# Patient Record
Sex: Male | Born: 1960 | Race: White | Hispanic: No | Smoking: Current every day smoker
Health system: Southern US, Community
[De-identification: ages and names within clinical notes are randomized; demographics above are authoritative.]

## PROBLEM LIST (undated history)

## (undated) DIAGNOSIS — K649 Unspecified hemorrhoids: Secondary | ICD-10-CM

## (undated) DIAGNOSIS — N390 Urinary tract infection, site not specified: Secondary | ICD-10-CM

## (undated) HISTORY — PX: CHOLECYSTECTOMY: SHX55

## (undated) HISTORY — PX: KNEE SURGERY: SHX244

---

## 2016-12-13 DIAGNOSIS — F17200 Nicotine dependence, unspecified, uncomplicated: Secondary | ICD-10-CM | POA: Insufficient documentation

## 2018-09-16 DIAGNOSIS — J438 Other emphysema: Secondary | ICD-10-CM | POA: Insufficient documentation

## 2018-09-16 DIAGNOSIS — Z86010 Personal history of colonic polyps: Secondary | ICD-10-CM | POA: Insufficient documentation

## 2018-12-13 ENCOUNTER — Other Ambulatory Visit: Payer: Self-pay

## 2018-12-13 ENCOUNTER — Encounter (HOSPITAL_BASED_OUTPATIENT_CLINIC_OR_DEPARTMENT_OTHER): Payer: Self-pay

## 2018-12-13 ENCOUNTER — Emergency Department (HOSPITAL_BASED_OUTPATIENT_CLINIC_OR_DEPARTMENT_OTHER): Payer: BLUE CROSS/BLUE SHIELD

## 2018-12-13 ENCOUNTER — Inpatient Hospital Stay (HOSPITAL_BASED_OUTPATIENT_CLINIC_OR_DEPARTMENT_OTHER)
Admission: EM | Admit: 2018-12-13 | Discharge: 2018-12-15 | DRG: 872 | Disposition: A | Discharge status: Home / Self Care | Payer: BLUE CROSS/BLUE SHIELD | Attending: Internal Medicine | Admitting: Internal Medicine

## 2018-12-13 DIAGNOSIS — A419 Sepsis, unspecified organism: Secondary | ICD-10-CM | POA: Diagnosis not present

## 2018-12-13 DIAGNOSIS — R6520 Severe sepsis without septic shock: Secondary | ICD-10-CM | POA: Diagnosis not present

## 2018-12-13 DIAGNOSIS — Z720 Tobacco use: Secondary | ICD-10-CM | POA: Diagnosis not present

## 2018-12-13 DIAGNOSIS — E871 Hypo-osmolality and hyponatremia: Secondary | ICD-10-CM | POA: Diagnosis present

## 2018-12-13 DIAGNOSIS — N390 Urinary tract infection, site not specified: Secondary | ICD-10-CM | POA: Diagnosis present

## 2018-12-13 DIAGNOSIS — F17210 Nicotine dependence, cigarettes, uncomplicated: Secondary | ICD-10-CM | POA: Diagnosis not present

## 2018-12-13 DIAGNOSIS — N451 Epididymitis: Secondary | ICD-10-CM | POA: Diagnosis not present

## 2018-12-13 DIAGNOSIS — N179 Acute kidney failure, unspecified: Secondary | ICD-10-CM | POA: Diagnosis not present

## 2018-12-13 DIAGNOSIS — N289 Disorder of kidney and ureter, unspecified: Secondary | ICD-10-CM | POA: Diagnosis not present

## 2018-12-13 DIAGNOSIS — E860 Dehydration: Secondary | ICD-10-CM | POA: Diagnosis not present

## 2018-12-13 DIAGNOSIS — Z9049 Acquired absence of other specified parts of digestive tract: Secondary | ICD-10-CM | POA: Diagnosis not present

## 2018-12-13 HISTORY — DX: Unspecified hemorrhoids: K64.9

## 2018-12-13 HISTORY — DX: Urinary tract infection, site not specified: N39.0

## 2018-12-13 LAB — CBC WITH DIFFERENTIAL/PLATELET
Abs Immature Granulocytes: 0.12 10*3/uL — ABNORMAL HIGH (ref 0.00–0.07)
Basophils Absolute: 0.1 10*3/uL (ref 0.0–0.1)
Basophils Relative: 0 %
EOS PCT: 0 %
Eosinophils Absolute: 0 10*3/uL (ref 0.0–0.5)
HEMATOCRIT: 44 % (ref 39.0–52.0)
Hemoglobin: 14.1 g/dL (ref 13.0–17.0)
Immature Granulocytes: 1 %
LYMPHS PCT: 4 %
Lymphs Abs: 1 10*3/uL (ref 0.7–4.0)
MCH: 29.5 pg (ref 26.0–34.0)
MCHC: 32 g/dL (ref 30.0–36.0)
MCV: 92.1 fL (ref 80.0–100.0)
Monocytes Absolute: 1.6 10*3/uL — ABNORMAL HIGH (ref 0.1–1.0)
Monocytes Relative: 7 %
Neutro Abs: 20.6 10*3/uL — ABNORMAL HIGH (ref 1.7–7.7)
Neutrophils Relative %: 88 %
Platelets: 307 10*3/uL (ref 150–400)
RBC: 4.78 MIL/uL (ref 4.22–5.81)
RDW: 12.4 % (ref 11.5–15.5)
SMEAR REVIEW: NORMAL
WBC: 23.5 10*3/uL — ABNORMAL HIGH (ref 4.0–10.5)
nRBC: 0 % (ref 0.0–0.2)

## 2018-12-13 LAB — URINALYSIS, MICROSCOPIC (REFLEX): WBC, UA: >50 WBC/hpf (ref 0–5)

## 2018-12-13 LAB — COMPREHENSIVE METABOLIC PANEL
ALT: 12 U/L (ref 0–44)
AST: 14 U/L — ABNORMAL LOW (ref 15–41)
Albumin: 4.2 g/dL (ref 3.5–5.0)
Alkaline Phosphatase: 84 U/L (ref 38–126)
Anion gap: 9 (ref 5–15)
BUN: 18 mg/dL (ref 6–20)
CO2: 24 mmol/L (ref 22–32)
CREATININE: 1.26 mg/dL — AB (ref 0.61–1.24)
Calcium: 9 mg/dL (ref 8.9–10.3)
Chloride: 98 mmol/L (ref 98–111)
GFR calc Af Amer: >60 mL/min (ref >60)
GFR calc non Af Amer: >60 mL/min (ref >60)
Glucose, Bld: 105 mg/dL — ABNORMAL HIGH (ref 70–99)
Potassium: 4.2 mmol/L (ref 3.5–5.1)
Sodium: 131 mmol/L — ABNORMAL LOW (ref 135–145)
Total Bilirubin: 1 mg/dL (ref 0.3–1.2)
Total Protein: 6.9 g/dL (ref 6.5–8.1)

## 2018-12-13 LAB — URINALYSIS, ROUTINE W REFLEX MICROSCOPIC
Glucose, UA: NEGATIVE mg/dL
Ketones, ur: 15 mg/dL — AB
NITRITE: NEGATIVE
Protein, ur: 100 mg/dL — AB
Specific Gravity, Urine: 1.02 (ref 1.005–1.030)
pH: 5.5 (ref 5.0–8.0)

## 2018-12-13 LAB — I-STAT CG4 LACTIC ACID, ED
Lactic Acid, Venous: 1.26 mmol/L (ref 0.5–1.9)
Lactic Acid, Venous: 1.31 mmol/L (ref 0.5–1.9)

## 2018-12-13 LAB — MRSA PCR SCREENING: MRSA by PCR: NEGATIVE

## 2018-12-13 MED ORDER — ACETAMINOPHEN 325 MG PO TABS: 650.0000 mg | ORAL_TABLET | Freq: Four times a day (QID) | ORAL | Status: DC | PRN

## 2018-12-13 MED ORDER — ACETAMINOPHEN 650 MG RE SUPP: 650.0000 mg | Freq: Four times a day (QID) | RECTAL | Status: DC | PRN

## 2018-12-13 MED ORDER — ONDANSETRON HCL 4 MG PO TABS: 4.0000 mg | ORAL_TABLET | Freq: Four times a day (QID) | ORAL | Status: DC | PRN

## 2018-12-13 MED ORDER — SODIUM CHLORIDE 0.9 % IV SOLN
INTRAVENOUS | Status: DC
  Administered 2018-12-13 – 2018-12-15 (×4): via INTRAVENOUS

## 2018-12-13 MED ORDER — FENTANYL CITRATE (PF) 100 MCG/2ML IJ SOLN
50.0000 ug | Freq: Once | INTRAMUSCULAR | Status: AC
  Administered 2018-12-13: 50 ug via INTRAVENOUS
  Filled 2018-12-13: qty 2

## 2018-12-13 MED ORDER — IPRATROPIUM-ALBUTEROL 0.5-2.5 (3) MG/3ML IN SOLN: 3.0000 mL | RESPIRATORY_TRACT | Status: DC | PRN

## 2018-12-13 MED ORDER — HYDROCODONE-ACETAMINOPHEN 5-325 MG PO TABS: 1.0000 | ORAL_TABLET | ORAL | Status: DC | PRN

## 2018-12-13 MED ORDER — LEVOFLOXACIN IN D5W 750 MG/150ML IV SOLN
750.0000 mg | Freq: Every day | INTRAVENOUS | Status: DC
  Administered 2018-12-13 – 2018-12-14 (×2): 750 mg via INTRAVENOUS
  Filled 2018-12-13 (×2): qty 150

## 2018-12-13 MED ORDER — SODIUM CHLORIDE 0.9 % IV BOLUS
1000.0000 mL | Freq: Once | INTRAVENOUS | Status: AC
  Administered 2018-12-13: 1000 mL via INTRAVENOUS

## 2018-12-13 MED ORDER — ONDANSETRON HCL 4 MG/2ML IJ SOLN: 4.0000 mg | Freq: Four times a day (QID) | INTRAMUSCULAR | Status: DC | PRN

## 2018-12-13 MED ORDER — ENOXAPARIN SODIUM 40 MG/0.4ML ~~LOC~~ SOLN
40.0000 mg | SUBCUTANEOUS | Status: DC
  Administered 2018-12-14: 40 mg via SUBCUTANEOUS
  Filled 2018-12-13: qty 0.4

## 2018-12-13 MED ORDER — SODIUM CHLORIDE 0.9 % IV SOLN
1.0000 g | INTRAVENOUS | Status: DC
  Administered 2018-12-13: 1 g via INTRAVENOUS
  Filled 2018-12-13 (×2): qty 10

## 2018-12-13 MED ORDER — IOPAMIDOL (ISOVUE-300) INJECTION 61%
100.0000 mL | Freq: Once | INTRAVENOUS | Status: AC | PRN
  Administered 2018-12-13: 100 mL via INTRAVENOUS

## 2018-12-13 NOTE — ED Notes (Signed)
Patient transported to CT 

## 2018-12-13 NOTE — H&P (Signed)
History and Physical    Joel MeyerDavid Fisk ZOX:[id number] DOB: February 13, 1961 DOA: 12/13/2018  Referring MD/NP/PA: Blanchard ManeElizabeth Matthews, MD PCP: Patient, No Pcp Per  [facility]  Chief Complaint: Difficulty urinating  I have personally briefly reviewed patient's [facility]   HPI: Joel Morton is a [age] y.o. male with medical history significant of UTIs; who presents with complaints of difficulty urinating since yesterday.  Patient reports that he was having severe pain with urination only dribbling small amounts of urine, suprapubic abdominal pain, and had swelling in his right testicle.  He thought that he had a urinary tract infection, and reports just recently being treated for urinary tract infection last month.  Associated symptoms include subjective fever, chills, blood in urine, low back pain, and malaise.  Patient reports that he smokes on a daily basis and has a chronic cough that is unchanged. Denies any alcohol or  illicit drug use.  Lastly, he has had sinus congestion and issues for over a year for which over-the-counter remedies have not helped.   He has not had any dysuria, diarrhea, or vomiting.   ED Course: On admission to the emergency department patient was noted to be febrile up to 101.9 F with blood pressures as low as 94/67, heart rates 84-1 20, respirations 16-29, and O2 saturations 93 to 100% on room air.  Labs revealed WBC 23.5 with left shift, sodium 131, BUN 18, creatinine 1.26, and lactic acid 1.26.  Urinalysis was significant for infection.  Sepsis protocol has been initiated and urine and cultures obtained.  Imaging studies revealed signs of epididymitis and bladder inflammation concerning for cystitis.  Patient has been started on empiric antibiotics Rocephin.  Dr. Sande BrothersWinters of urology was consulted and recommended Levaquin for 3 weeks with Levaquin.  TRH called to admit patient accepted to a stepdown bed.   Review of Systems    Constitutional: Positive for chills and fever.  HENT: Positive for congestion. Negative for nosebleeds.   Eyes: Negative for photophobia and pain.  Respiratory: Positive for cough and sputum production. Negative for hemoptysis and shortness of breath.   Cardiovascular: Negative for chest pain and leg swelling.  Gastrointestinal: Positive for abdominal pain. Negative for diarrhea, nausea and vomiting.  Genitourinary: Positive for flank pain, hematuria and urgency.  Musculoskeletal: Positive for back pain. Negative for falls and myalgias.  Skin: Negative for itching and rash.  Neurological: Negative for focal weakness and loss of consciousness.  Endo/Heme/Allergies: Negative for polydipsia.  Psychiatric/Behavioral: Negative for hallucinations and memory loss.    Past Medical History:  Diagnosis Date  . Hemorrhoid   . UTI (urinary tract infection)     Past Surgical History:  Procedure Laterality Date  . CHOLECYSTECTOMY    . KNEE SURGERY       reports that he has been smoking cigarettes. He has never used smokeless tobacco. He reports that he does not drink alcohol or use drugs.  No Known Allergies  Family History  Problem Relation Age of Onset  . Heart disease Mother   . Hypertension Mother   . Heart disease Father   . Hypertension Father     Prior to Admission medications   Not on File    Physical Exam:  Constitutional: Male who appears to be older than stated age in acute distress at this time Vitals:   12/13/18 1904 12/13/18 2000 12/13/18 2040 12/13/18 2100  BP: 110/65 96/66  103/61  Pulse: 98 91  95  Resp: Marland Kitchen(!)  29 17  (!) 28  Temp: 99 F (37.2 C)  98.7 F (37.1 C)   TempSrc: Oral  Oral   SpO2: 98% 93%  95%  Weight: 104 kg     Height: 6\' 4"  (1.93 m)      Eyes: PERRL, lids and conjunctivae normal ENMT: Mucous membranes are moist. Posterior pharynx clear of any exudate or lesions.   Neck: normal, supple, no masses, no thyromegaly Respiratory: Decreased  overall aeration but no significant wheezes or rhonchi appreciated.  Patient maintaining O2 saturations on room air. Cardiovascular: Tachycardic, no murmurs / rubs / gallops. No extremity edema. 2+ pedal pulses. No carotid bruits.  Abdomen: Mild suprapubic tenderness noted.  GU exam deferred.  Bowel sounds positive.  Musculoskeletal: no clubbing / cyanosis. No joint deformity upper and lower extremities. Good ROM, no contractures. Normal muscle tone.  Skin: no rashes, lesions, ulcers. No induration Neurologic: CN 2-12 grossly intact. Sensation intact, DTR normal. Strength 5/5 in all 4.  Psychiatric: Normal judgment and insight. Alert and oriented x 3. Normal mood.     Labs on Admission: I have personally reviewed following labs and imaging studies  CBC: Recent Labs  Lab 12/13/18 1326  WBC 23.5*  NEUTROABS 20.6*  HGB 14.1  HCT 44.0  MCV 92.1  PLT 307   Basic Metabolic Panel: Recent Labs  Lab 12/13/18 1326  NA 131*  K 4.2  CL 98  CO2 24  GLUCOSE 105*  BUN 18  CREATININE 1.26*  CALCIUM 9.0   GFR: Estimated Creatinine Clearance: 78.5 mL/min (A) (by C-G formula based on SCr of 1.26 mg/dL (H)). Liver Function Tests: Recent Labs  Lab 12/13/18 1326  AST 14*  ALT 12  ALKPHOS 84  BILITOT 1.0  PROT 6.9  ALBUMIN 4.2   No results for input(s): LIPASE, AMYLASE in the last 168 hours. No results for input(s): AMMONIA in the last 168 hours. Coagulation Profile: No results for input(s): INR, PROTIME in the last 168 hours. Cardiac Enzymes: No results for input(s): CKTOTAL, CKMB, CKMBINDEX, TROPONINI in the last 168 hours. BNP (last 3 results) No results for input(s): PROBNP in the last 8760 hours. HbA1C: No results for input(s): HGBA1C in the last 72 hours. CBG: No results for input(s): GLUCAP in the last 168 hours. Lipid Profile: No results for input(s): CHOL, HDL, LDLCALC, TRIG, CHOLHDL, LDLDIRECT in the last 72 hours. Thyroid Function Tests: No results for input(s):  TSH, T4TOTAL, FREET4, T3FREE, THYROIDAB in the last 72 hours. Anemia Panel: No results for input(s): VITAMINB12, FOLATE, FERRITIN, TIBC, IRON, RETICCTPCT in the last 72 hours. Urine analysis:    Component Value Date/Time   COLORURINE YELLOW 12/13/2018 1226   APPEARANCEUR CLOUDY (A) 12/13/2018 1226   LABSPEC 1.020 12/13/2018 1226   PHURINE 5.5 12/13/2018 1226   GLUCOSEU NEGATIVE 12/13/2018 1226   HGBUR LARGE (A) 12/13/2018 1226   BILIRUBINUR SMALL (A) 12/13/2018 1226   KETONESUR 15 (A) 12/13/2018 1226   PROTEINUR 100 (A) 12/13/2018 1226   NITRITE NEGATIVE 12/13/2018 1226   LEUKOCYTESUR LARGE (A) 12/13/2018 1226   Sepsis Labs: No results found for this or any previous visit (from the past 240 hour(s)).   Radiological Exams on Admission: Ct Abdomen Pelvis W Contrast  Result Date: 12/13/2018 CLINICAL DATA:  57 y/o  M; 2 days of right testicle pain. EXAM: CT ABDOMEN AND PELVIS WITH CONTRAST TECHNIQUE: Multidetector CT imaging of the abdomen and pelvis was performed using the standard protocol following bolus administration of intravenous contrast. CONTRAST:   ISOVUE-300 IOPAMIDOL (ISOVUE-300) INJECTION 61% COMPARISON:  12/13/2018 scrotum ultrasound. FINDINGS: Lower chest: No acute abnormality. Hepatobiliary: No focal liver abnormality is seen. Status post cholecystectomy. No biliary dilatation. Pancreas: Unremarkable. No pancreatic ductal dilatation or surrounding inflammatory changes. Spleen: Normal in size without focal abnormality. Adrenals/Urinary Tract: Adrenal glands are unremarkable. Kidneys are normal, without renal calculi, focal lesion, or hydronephrosis. Diffuse bladder wall thickening. Stomach/Bowel: Stomach is within normal limits. Appendix appears normal. No evidence of bowel wall thickening, distention, or inflammatory changes. Vascular/Lymphatic: Aortic atherosclerosis. No enlarged abdominal or pelvic lymph nodes. Reproductive: Inflammation within the pelvis surrounding  seminal vesicles and prostate extending along course of the enhancing right spermatic cord into the right inguinal canal. Other: No abdominal wall hernia or abnormality. No abdominopelvic ascites. Musculoskeletal: No acute or significant osseous findings. IMPRESSION: 1. Inflammation within the pelvis surrounding seminal vesicles and prostate extending along course of the enhancing right spermatic cord into the right inguinal canal. Epididymitis on same-day scrotal ultrasound. Findings likely represent infection of the genital system. 2. Diffuse bladder wall thickening may be reactive, reflect cystitis, or chronic outflow obstruction. Electronically Signed   By: Mitzi HansenLance  Furusawa-Stratton M.D.   On: 12/13/2018 15:28   [place] Scrotum W/doppler  Result Date: 12/13/2018 CLINICAL DATA:  Right testicle pain for 2 days with decreased urine output EXAM: SCROTAL ULTRASOUND DOPPLER ULTRASOUND OF THE TESTICLES TECHNIQUE: Complete ultrasound examination of the testicles, epididymis, and other scrotal structures was performed. Color and spectral Doppler ultrasound were also utilized to evaluate blood flow to the testicles. COMPARISON:  None. FINDINGS: Right testicle Measurements: 4.6 x 3.2 x 3.5 cm. No mass or microlithiasis visualized. Heterogeneous hypervascular echogenic tissue superior to the right testis. Left testicle Measurements: 5.4 x 2.6 x 3.4 cm. No mass or microlithiasis visualized. Right epididymis: Markedly enlarged and hypervascular. Cyst in the epididymal tail measuring 1.3 cm Left epididymis:  Small cyst at the head measuring 9 mm. Hydrocele:  Moderate complex right hydrocele with septations. Varicocele:  Small left varicocele. Pulsed Doppler interrogation of both testes demonstrates normal low resistance arterial and venous waveforms bilaterally. IMPRESSION: 1. Negative for testicular torsion 2. Markedly enlarged and hyperemic right epididymis consistent with epididymitis. Moderate complex right hydrocele,  possible pyocele. 3. Echogenic soft tissue superior to the right testis, possibly represents edematous spermatic cord, although could consider a hernia. CT could be obtained to exclude bowel containing hernia in the right inguinal region. Electronically Signed   By: Jasmine PangKim  Fujinaga M.D.   On: 12/13/2018 14:21    CT scan of abdomen pelvis: Independently reviewed.  Visualized inflammation of the bladder and right testicle  Assessment/Plan Sepsis secondary to urinary tract infection and right epididymitis: Patient presents with fever up to 101.9 F, tachycardia, tachypnea, and leukocytosis.  Lactic acids were reassuring.  Patient was initially noted to be hypotensive, but responded adequately to IV fluid hydration.  Urinalysis showed signs of infection along with imaging studies confirm showed cystitis and right epididymitis.  Patient had initially been given Rocephin IV.  Urology was consulted and recommended 3 weeks of Levaquin. - Admited to stepdown bed transferred to a telemetry bed - Follow-up blood and urine cultures - Changed antibiotics to Levaquin per urology recommendation - Tylenol as needed for fever - Scrotal support - Hydrocodone as needed pain  Renal insufficiency: Patient noted to have a creatinine of 1.11 on 09/13/2018 on care everywhere records.  He presents with a creatinine of 1.26 with BUN 18. - IV fluids at 18000ml/hr - Monitor intake and output  Transient  hypotension: Patient blood pressure was noted to be stable after IV fluids. - Continue to monitor and bolus IV fluids as needed  Sinus sinus congestion: Patient reports sinus congestion and postnasal drip for a year.  Patient already on antibiotics which should cover for any sinus infection. - Mucinex and Claritin  Hyponatremia: Acute.  Sodium 131 on admission.  The aspect of dehydration - IV fluids as seen above - Recheck sodium in a.m.  Tobacco abuse: Patient notes daily use of tobacco. - Nicotine patch offered  DVT  prophylaxis: lovenox Code Status: Full Family Communication: No family present at bedside Disposition Plan: Likely discharge home in 2 to 3 days Consults called: urology Admission status: inpatient   Clydie Braun MD [facility] Pager [phone]   If 7PM-7AM, please contact night-coverage [email or web address] Password [facility]  12/13/2018, 9:32 PM

## 2018-12-13 NOTE — ED Triage Notes (Signed)
Pt c/o right testicle pain and decreased urine x 2 days-reports hx of UTIs-NAD-steady gait

## 2018-12-13 NOTE — ED Notes (Signed)
Po fluid given

## 2018-12-13 NOTE — Plan of Care (Signed)
57 year old male with no significant past medical history admitted with urosepsis.  Patient febrile to 101.9 tachycardic and hypotensive on arrival to the ER.  CT scan and ultrasound of the scrotum revealed epididymitis.  ER physician spoke with urology Dr. Sande BrothersWinters recommended treatment with levofloxacin for 3 weeks and to give him daily Flomax.

## 2018-12-13 NOTE — ED Provider Notes (Signed)
[facility] EMERGENCY DEPARTMENT Provider Note   CSN: [id number] Arrival date & time: 12/13/18  1102     History   Chief Complaint Chief Complaint  Patient presents with  . Groin Swelling    HPI Joel Morton is a 57 y.o. male.  The history is provided by the patient. No language interpreter was used.   Joel Morton is a 57 y.o. male who presents to the Emergency Department complaining of "I think I have a UTI". Yesterday he developed pain in his low back, dribbling urination with dysuria and swelling of his right testicle. He denies any fevers, nausea, vomiting. He does have mild lower abdominal pain. No penile discharge. No prior similar symptoms. He is currently separated and is not sexually active. He does smoke cigarettes. No medical problems. He does not drink alcohol or use drugs.  Sxs are severe and constant in nature.   Past Medical History:  Diagnosis Date  . Hemorrhoid   . UTI (urinary tract infection)     There are no active problems to display for this patient.   Past Surgical History:  Procedure Laterality Date  . CHOLECYSTECTOMY    . KNEE SURGERY          Home Medications    Prior to Admission medications   Not on File    Family History No family history on file.  Social History Social History   Tobacco Use  . Smoking status: Current Every Day Smoker    Types: Cigarettes  . Smokeless tobacco: Never Used  Substance Use Topics  . Alcohol use: Never    Frequency: Never  . Drug use: Never     Allergies   Patient has no known allergies.   Review of Systems Review of Systems  All other systems reviewed and are negative.    Physical Exam Updated Vital Signs BP 94/67   Pulse 99   Temp 100 F (37.8 C)   Resp 17   Ht 6\' 4"  (1.93 m)   Wt 109.8 kg   SpO2 100%   BMI 29.46 kg/m   Physical Exam Vitals signs and nursing note reviewed.  Constitutional:      Appearance: He is well-developed.  HENT:     Head:  Normocephalic and atraumatic.  Cardiovascular:     Rate and Rhythm: Regular rhythm.     Heart sounds: No murmur.     Comments: Tachycardic Pulmonary:     Effort: Pulmonary effort is normal. No respiratory distress.     Breath sounds: Normal breath sounds.  Abdominal:     Palpations: Abdomen is soft.     Tenderness: There is no abdominal tenderness. There is no guarding or rebound.  Genitourinary:    Comments: Moderate to severe swelling of the right testicle with significant tenderness to palpation. Musculoskeletal:        General: No swelling or tenderness.  Skin:    General: Skin is warm and dry.  Neurological:     Mental Status: He is alert and oriented to person, place, and time.  Psychiatric:        Mood and Affect: Mood normal.        Behavior: Behavior normal.      ED Treatments / Results  Labs (all labs ordered are listed, but only abnormal results are displayed) Labs Reviewed  URINALYSIS, ROUTINE W REFLEX MICROSCOPIC - Abnormal; Notable for the following components:      Result Value   APPearance CLOUDY (*)  Hgb urine dipstick LARGE (*)    Bilirubin Urine SMALL (*)    Ketones, ur 15 (*)    Protein, ur 100 (*)    Leukocytes, UA LARGE (*)    All other components within normal limits  URINALYSIS, MICROSCOPIC (REFLEX) - Abnormal; Notable for the following components:   Bacteria, UA MANY (*)    All other components within normal limits  COMPREHENSIVE METABOLIC PANEL - Abnormal; Notable for the following components:   Sodium 131 (*)    Glucose, Bld 105 (*)    Creatinine, Ser 1.26 (*)    AST 14 (*)    All other components within normal limits  CBC WITH DIFFERENTIAL/PLATELET - Abnormal; Notable for the following components:   WBC 23.5 (*)    Neutro Abs 20.6 (*)    Monocytes Absolute 1.6 (*)    Abs Immature Granulocytes 0.12 (*)    All other components within normal limits  URINE CULTURE  CULTURE, BLOOD (ROUTINE X 2)  CULTURE, BLOOD (ROUTINE X 2)  RPR  HIV  ANTIBODY (ROUTINE TESTING W REFLEX)  I-STAT CG4 LACTIC ACID, ED  I-STAT CG4 LACTIC ACID, ED  GC/CHLAMYDIA PROBE AMP () NOT [facility]    EKG None  Radiology Ct Abdomen Pelvis W Contrast  Result Date: 12/13/2018 CLINICAL DATA:  57 y/o  M; 2 days of right testicle pain. EXAM: CT ABDOMEN AND PELVIS WITH CONTRAST TECHNIQUE: Multidetector CT imaging of the abdomen and pelvis was performed using the standard protocol following bolus administration of intravenous contrast. CONTRAST:  100mL ISOVUE-300 IOPAMIDOL (ISOVUE-300) INJECTION 61% COMPARISON:  12/13/2018 scrotum ultrasound. FINDINGS: Lower chest: No acute abnormality. Hepatobiliary: No focal liver abnormality is seen. Status post cholecystectomy. No biliary dilatation. Pancreas: Unremarkable. No pancreatic ductal dilatation or surrounding inflammatory changes. Spleen: Normal in size without focal abnormality. Adrenals/Urinary Tract: Adrenal glands are unremarkable. Kidneys are normal, without renal calculi, focal lesion, or hydronephrosis. Diffuse bladder wall thickening. Stomach/Bowel: Stomach is within normal limits. Appendix appears normal. No evidence of bowel wall thickening, distention, or inflammatory changes. Vascular/Lymphatic: Aortic atherosclerosis. No enlarged abdominal or pelvic lymph nodes. Reproductive: Inflammation within the pelvis surrounding seminal vesicles and prostate extending along course of the enhancing right spermatic cord into the right inguinal canal. Other: No abdominal wall hernia or abnormality. No abdominopelvic ascites. Musculoskeletal: No acute or significant osseous findings. IMPRESSION: 1. Inflammation within the pelvis surrounding seminal vesicles and prostate extending along course of the enhancing right spermatic cord into the right inguinal canal. Epididymitis on same-day scrotal ultrasound. Findings likely represent infection of the genital system. 2. Diffuse bladder wall thickening may be reactive,  reflect cystitis, or chronic outflow obstruction. Electronically Signed   By: Mitzi HansenLance  Furusawa-Stratton M.D.   On: 12/13/2018 15:28   [place] Scrotum W/doppler  Result Date: 12/13/2018 CLINICAL DATA:  Right testicle pain for 2 days with decreased urine output EXAM: SCROTAL ULTRASOUND DOPPLER ULTRASOUND OF THE TESTICLES TECHNIQUE: Complete ultrasound examination of the testicles, epididymis, and other scrotal structures was performed. Color and spectral Doppler ultrasound were also utilized to evaluate blood flow to the testicles. COMPARISON:  None. FINDINGS: Right testicle Measurements: 4.6 x 3.2 x 3.5 cm. No mass or microlithiasis visualized. Heterogeneous hypervascular echogenic tissue superior to the right testis. Left testicle Measurements: 5.4 x 2.6 x 3.4 cm. No mass or microlithiasis visualized. Right epididymis: Markedly enlarged and hypervascular. Cyst in the epididymal tail measuring 1.3 cm Left epididymis:  Small cyst at the head measuring 9 mm. Hydrocele:  Moderate complex right hydrocele  with septations. Varicocele:  Small left varicocele. Pulsed Doppler interrogation of both testes demonstrates normal low resistance arterial and venous waveforms bilaterally. IMPRESSION: 1. Negative for testicular torsion 2. Markedly enlarged and hyperemic right epididymis consistent with epididymitis. Moderate complex right hydrocele, possible pyocele. 3. Echogenic soft tissue superior to the right testis, possibly represents edematous spermatic cord, although could consider a hernia. CT could be obtained to exclude bowel containing hernia in the right inguinal region. Electronically Signed   By: Jasmine PangKim  Fujinaga M.D.   On: 12/13/2018 14:21    Procedures Procedures (including critical care time) CRITICAL CARE Performed by: Tilden FossaElizabeth Colleen Kotlarz   Total critical care time: 35 minutes  Critical care time was exclusive of separately billable procedures and treating other patients.  Critical care was necessary to treat or  prevent imminent or life-threatening deterioration.  Critical care was time spent personally by me on the following activities: development of treatment plan with patient and/or surrogate as well as nursing, discussions with consultants, evaluation of patient's response to treatment, examination of patient, obtaining history from patient or surrogate, ordering and performing treatments and interventions, ordering and review of laboratory studies, ordering and review of radiographic studies, pulse oximetry and re-evaluation of patient's condition.  Medications Ordered in ED Medications  cefTRIAXone (ROCEPHIN) 1 g in sodium chloride 0.9 % 100 mL IVPB ( Intravenous Stopped 12/13/18 1447)  sodium chloride 0.9 % bolus 1,000 mL (1,000 mLs Intravenous New Bag/Given 12/13/18 1530)  sodium chloride 0.9 % bolus 1,000 mL ( Intravenous Stopped 12/13/18 1507)  fentaNYL (SUBLIMAZE) injection 50 mcg (50 mcg Intravenous Given 12/13/18 1324)  iopamidol (ISOVUE-300) 61 % injection 100 mL (100 mLs Intravenous Contrast Given 12/13/18 1510)     Initial Impression / Assessment and Plan / ED Course  I have reviewed the triage vital signs and the nursing notes.  Pertinent labs & imaging results that were available during my care of the patient were reviewed by me and considered in my medical decision making (see chart for details).     Patient here for evaluation of scrotal swelling, pain and difficulty with urination for the last 24 hours. He is tachycardic and dehydrated appearing on evaluation. He was treated with IV fluids and IV antibiotics for presumed sepsis of urinary source. Ultrasound is consistent with epididymitis and UA is consistent with UTI. BMP demonstrates acute kidney injury. Lactate is with normal limits. Discussed the case with Dr. Liliane ShiWinter with urology. No surgical intervention needed at this time. He recommends flomax daily, levaquin daily x 3 weeks. Patient updated of findings of studies and  recommendation for admission and he is in agreement with treatment plan. Hospitalist consulted for admission.  Final Clinical Impressions(s) / ED Diagnoses   Final diagnoses:  Sepsis with acute organ dysfunction without septic shock, due to unspecified organism, unspecified type [facility](HCC)  Epididymitis  Acute UTI    ED Discharge Orders    None       Tilden Fossaees, Deundre Thong, MD 12/13/18 1558

## 2018-12-13 NOTE — ED Notes (Signed)
Assumed care of pt. At 1305. Pt to US before IV a/b.

## 2018-12-14 ENCOUNTER — Encounter (HOSPITAL_COMMUNITY): Payer: Self-pay | Admitting: Internal Medicine

## 2018-12-14 DIAGNOSIS — N451 Epididymitis: Secondary | ICD-10-CM | POA: Diagnosis present

## 2018-12-14 DIAGNOSIS — Z720 Tobacco use: Secondary | ICD-10-CM | POA: Diagnosis present

## 2018-12-14 DIAGNOSIS — E871 Hypo-osmolality and hyponatremia: Secondary | ICD-10-CM | POA: Diagnosis present

## 2018-12-14 DIAGNOSIS — N390 Urinary tract infection, site not specified: Secondary | ICD-10-CM | POA: Diagnosis present

## 2018-12-14 DIAGNOSIS — N289 Disorder of kidney and ureter, unspecified: Secondary | ICD-10-CM

## 2018-12-14 LAB — BASIC METABOLIC PANEL
Anion gap: 16 — ABNORMAL HIGH (ref 5–15)
BUN: 15 mg/dL (ref 6–20)
CHLORIDE: 103 mmol/L (ref 98–111)
CO2: 15 mmol/L — ABNORMAL LOW (ref 22–32)
Calcium: 8.3 mg/dL — ABNORMAL LOW (ref 8.9–10.3)
Creatinine, Ser: 0.98 mg/dL (ref 0.61–1.24)
GFR calc Af Amer: >60 mL/min (ref >60)
GFR calc non Af Amer: >60 mL/min (ref >60)
Glucose, Bld: 98 mg/dL (ref 70–99)
Potassium: 4 mmol/L (ref 3.5–5.1)
SODIUM: 134 mmol/L — AB (ref 135–145)

## 2018-12-14 LAB — URINE CULTURE: Culture: NO GROWTH

## 2018-12-14 LAB — CBC
HCT: 40.1 % (ref 39.0–52.0)
HEMOGLOBIN: 12.9 g/dL — AB (ref 13.0–17.0)
MCH: 29.8 pg (ref 26.0–34.0)
MCHC: 32.2 g/dL (ref 30.0–36.0)
MCV: 92.6 fL (ref 80.0–100.0)
Platelets: 287 10*3/uL (ref 150–400)
RBC: 4.33 MIL/uL (ref 4.22–5.81)
RDW: 12.6 % (ref 11.5–15.5)
WBC: 18.7 10*3/uL — ABNORMAL HIGH (ref 4.0–10.5)
nRBC: 0 % (ref 0.0–0.2)

## 2018-12-14 LAB — HIV ANTIBODY (ROUTINE TESTING W REFLEX): HIV Screen 4th Generation wRfx: NONREACTIVE

## 2018-12-14 LAB — RPR: RPR Ser Ql: NONREACTIVE

## 2018-12-14 MED ORDER — NICOTINE 21 MG/24HR TD PT24
21.0000 mg | MEDICATED_PATCH | Freq: Every day | TRANSDERMAL | Status: DC
  Administered 2018-12-14 – 2018-12-15 (×3): 21 mg via TRANSDERMAL
  Filled 2018-12-14 (×3): qty 1

## 2018-12-14 MED ORDER — LORATADINE 10 MG PO TABS
10.0000 mg | ORAL_TABLET | Freq: Every day | ORAL | Status: DC
  Administered 2018-12-14 – 2018-12-15 (×2): 10 mg via ORAL
  Filled 2018-12-14 (×2): qty 1

## 2018-12-14 MED ORDER — GUAIFENESIN ER 600 MG PO TB12
600.0000 mg | ORAL_TABLET | Freq: Two times a day (BID) | ORAL | Status: DC
  Administered 2018-12-14 – 2018-12-15 (×3): 600 mg via ORAL
  Filled 2018-12-14 (×4): qty 1

## 2018-12-14 NOTE — Plan of Care (Signed)
Patient states his dysuria has improved, scrotum still edematous and uncomfortable but patient defers pain medication.  Tolerating diet, walked in hallways and sat in chair most of shift.

## 2018-12-14 NOTE — Progress Notes (Signed)
PROGRESS NOTE    Joel Morton  HYQ:[id number] DOB: 13-Jul-1961 DOA: 12/13/2018 PCP: Patient, No Pcp Per   Brief Narrative:  Per HPI  Joel Morton is Joel Morton 57 y.o. male with medical history significant of UTIs; who presents with complaints of difficulty urinating since yesterday.  Patient reports that he was having severe pain with urination only dribbling small amounts of urine, suprapubic abdominal pain, and had swelling in his right testicle.  He thought that he had Joel Morton urinary tract infection, and reports just recently being treated for urinary tract infection last month.  Associated symptoms include subjective fever, chills, blood in urine, low back pain, and malaise.  Patient reports that he smokes on Joel Morton daily basis and has Joel Morton chronic cough that is unchanged. Denies any alcohol or  illicit drug use.  Lastly, he has had sinus congestion and issues for over [name] year for which over-the-counter remedies have not helped.   He has not had any dysuria, diarrhea, or vomiting.   Assessment & Plan:   Principal Problem:   Sepsis (HCC) Active Problems:   Right epididymitis   Acute lower UTI   Hyponatremia   Tobacco abuse   Renal insufficiency  Sepsis secondary to urinary tract infection and right epididymitis: Patient presents with fever up to 101.9 F, tachycardia, tachypnea, and leukocytosis.  Lactic acids were reassuring.  Patient was initially noted to be hypotensive, but responded adequately to IV fluid hydration.  Urinalysis showed signs of infection along with imaging studies confirm showed cystitis and right epididymitis.  Patient had initially been given Rocephin IV.  Urology was consulted and recommended 3 weeks of Levaquin.  He still notes significant pain and discomfort. - Leukocytosis improving, but still elevated - Follow-up blood (pending) and urine cultures (no growth) - Continue levaquin per urology - Follow gonorrhea/chlamydia, negative HIV - Tylenol as needed for fever - Scrotal  support - Hydrocodone as needed pain  Renal insufficiency: improved, continue to monitor  Transient hypotension: Patient blood pressure was noted to be stable after IV fluids. - Continue to monitor and bolus IV fluids as needed  Sinus congestion: Patient reports sinus congestion and postnasal drip for Joel Morton year.  Patient already on antibiotics which should cover for any sinus infection. - Mucinex and Claritin  Hyponatremia: Improved Follow  Tobacco abuse: Patient notes daily use of tobacco. - Nicotine patch offered  DVT prophylaxis: lovenox Code Status: full  Family Communication: none at bedside Disposition Plan: pending improvement, likely within 24-48 hrs   Consultants:   none  Procedures:   none  Antimicrobials: Anti-infectives (From admission, onward)   Start     Dose/Rate Route Frequency Ordered Stop   12/13/18 2315  levofloxacin (LEVAQUIN) IVPB 750 mg     750 mg 100 mL/hr over 90 Minutes Intravenous Daily at bedtime 12/13/18 2246     12/13/18 1300  cefTRIAXone (ROCEPHIN) 1 g in sodium chloride 0.9 % 100 mL IVPB  Status:  Discontinued     1 g 200 mL/hr over 30 Minutes Intravenous Every 24 hours 12/13/18 1253 12/14/18 1309     Subjective: Still with pain to scrotum On R side  Objective: Vitals:   12/14/18 0100 12/14/18 0129 12/14/18 0553 12/14/18 1338  BP: 103/62 104/64 96/68 115/74  Pulse: (!) 111 98 96 91  Resp: (!) 23 (!) 24 20 20   Temp:  100 F (37.8 C) 99.6 F (37.6 C) 99.8 F (37.7 C)  TempSrc:  Oral Oral Oral  SpO2: 98% 98% 98% 100%  Weight:  Height:        Intake/Output Summary (Last 24 hours) at 12/14/2018 1943 Last data filed at 12/14/2018 1800 Gross per 24 hour  Intake 1216.18 ml  Output 1100 ml  Net 116.18 ml   Filed Weights   12/13/18 1128 12/13/18 1904  Weight: 109.8 kg 104 kg    Examination:  General exam: Appears calm and comfortable  Respiratory system: Clear to auscultation. Respiratory effort  normal. Cardiovascular system: S1 & S2 heard, RRR.  Gastrointestinal system: Abdomen is nondistended, soft and nontender.  GU: TTP of R testicle Central nervous system: Alert and oriented. No focal neurological deficits. Extremities: moving all extremities Skin: No rashes, lesions or ulcers Psychiatry: Judgement and insight appear normal. Mood & affect appropriate.     Data Reviewed: I have personally reviewed following labs and imaging studies  CBC: Recent Labs  Lab 12/13/18 1326 12/14/18 0452  WBC 23.5* 18.7*  NEUTROABS 20.6*  --   HGB 14.1 12.9*  HCT 44.0 40.1  MCV 92.1 92.6  PLT 307 287   Basic Metabolic Panel: Recent Labs  Lab 12/13/18 1326 12/14/18 0452  NA 131* 134*  K 4.2 4.0  CL 98 103  CO2 24 15*  GLUCOSE 105* 98  BUN 18 15  CREATININE 1.26* 0.98  CALCIUM 9.0 8.3*   GFR: Estimated Creatinine Clearance: 100.9 mL/min (by C-G formula based on SCr of 0.98 mg/dL). Liver Function Tests: Recent Labs  Lab 12/13/18 1326  AST 14*  ALT 12  ALKPHOS 84  BILITOT 1.0  PROT 6.9  ALBUMIN 4.2   No results for input(s): LIPASE, AMYLASE in the last 168 hours. No results for input(s): AMMONIA in the last 168 hours. Coagulation Profile: No results for input(s): INR, PROTIME in the last 168 hours. Cardiac Enzymes: No results for input(s): CKTOTAL, CKMB, CKMBINDEX, TROPONINI in the last 168 hours. BNP (last 3 results) No results for input(s): PROBNP in the last 8760 hours. HbA1C: No results for input(s): HGBA1C in the last 72 hours. CBG: No results for input(s): GLUCAP in the last 168 hours. Lipid Profile: No results for input(s): CHOL, HDL, LDLCALC, TRIG, CHOLHDL, LDLDIRECT in the last 72 hours. Thyroid Function Tests: No results for input(s): TSH, T4TOTAL, FREET4, T3FREE, THYROIDAB in the last 72 hours. Anemia Panel: No results for input(s): VITAMINB12, FOLATE, FERRITIN, TIBC, IRON, RETICCTPCT in the last 72 hours. Sepsis Labs: Recent Labs  Lab  12/13/18 1326 12/13/18 1529  LATICACIDVEN 1.26 1.31    Recent Results (from the past 240 hour(s))  Urine culture     Status: None   Collection Time: 12/13/18 12:26 PM  Result Value Ref Range Status   Specimen Description   Final    URINE, CLEAN CATCH Performed at [facility] [place], [address] [place], [place] [address]    Special Requests   Final    NONE Performed at [facility] [place], [address] [place], [place] [address]    Culture   Final    NO GROWTH Performed at [facility], [address] [place], [place] [address]    Report Status 12/14/2018 FINAL  Final  Blood Culture (routine x 2)     Status: None (Preliminary result)   Collection Time: 12/13/18  1:20 PM  Result Value Ref Range Status   Specimen Description   Final    BLOOD RIGHT ARM Performed at [facility] [place], [address] [place], [place] [address]    Special Requests  Final    BOTTLES DRAWN AEROBIC AND ANAEROBIC Blood Culture adequate volume Performed at [facility], [address] [place], [place] [address]    Culture   Final    NO GROWTH < 24 HOURS Performed at [facility], [address] [place], [place] [address]    Report Status PENDING  Incomplete  Blood Culture (routine x 2)     Status: None (Preliminary result)   Collection Time: 12/13/18  1:35 PM  Result Value Ref Range Status   Specimen Description   Final    BLOOD LEFT HAND Performed at [facility], [address] [place], [place] [address]    Special Requests   Final    BOTTLES DRAWN AEROBIC AND ANAEROBIC Blood Culture adequate volume Performed at [facility], [address] [place], [place] [address]    Culture   Final    NO GROWTH < 24 HOURS Performed at [facility], [address] [place], [place] [address]    Report Status PENDING  Incomplete  MRSA PCR Screening     Status: None   Collection Time: 12/13/18  7:12 PM  Result Value Ref  Range Status   MRSA by PCR NEGATIVE NEGATIVE Final    Comment:        The GeneXpert MRSA Assay (FDA approved for NASAL specimens only), is one component of Cathye Kreiter comprehensive MRSA colonization surveillance program. It is not intended to diagnose MRSA infection nor to guide or monitor treatment for MRSA infections. Performed at [facility], [address], [place], [place] [address]          Radiology Studies: Ct Abdomen Pelvis W Contrast  Result Date: 12/13/2018 CLINICAL DATA:  57 y/o  M; 2 days of right testicle pain. EXAM: CT ABDOMEN AND PELVIS WITH CONTRAST TECHNIQUE: Multidetector CT imaging of the abdomen and pelvis was performed using the standard protocol following bolus administration of intravenous contrast. CONTRAST:  ISOVUE-300 IOPAMIDOL (ISOVUE-300) INJECTION 61% COMPARISON:  12/13/2018 scrotum ultrasound. FINDINGS: Lower chest: No acute abnormality. Hepatobiliary: No focal liver abnormality is seen. Status post cholecystectomy. No biliary dilatation. Pancreas: Unremarkable. No pancreatic ductal dilatation or surrounding inflammatory changes. Spleen: Normal in size without focal abnormality. Adrenals/Urinary Tract: Adrenal glands are unremarkable. Kidneys are normal, without renal calculi, focal lesion, or hydronephrosis. Diffuse bladder wall thickening. Stomach/Bowel: Stomach is within normal limits. Appendix appears normal. No evidence of bowel wall thickening, distention, or inflammatory changes. Vascular/Lymphatic: Aortic atherosclerosis. No enlarged abdominal or pelvic lymph nodes. Reproductive: Inflammation within the pelvis surrounding seminal vesicles and prostate extending along course of the enhancing right spermatic cord into the right inguinal canal. Other: No abdominal wall hernia or abnormality. No abdominopelvic ascites. Musculoskeletal: No acute or significant osseous findings. IMPRESSION: 1. Inflammation within the pelvis surrounding seminal  vesicles and prostate extending along course of the enhancing right spermatic cord into the right inguinal canal. Epididymitis on same-day scrotal ultrasound. Findings likely represent infection of the genital system. 2. Diffuse bladder wall thickening may be reactive, reflect cystitis, or chronic outflow obstruction. Electronically Signed   By: Mitzi Hansen M.D.   On: 12/13/2018 15:28   US Scrotum W/doppler  Result Date: 12/13/2018 CLINICAL DATA:  Right testicle pain for 2 days with decreased urine output EXAM: SCROTAL ULTRASOUND DOPPLER ULTRASOUND OF THE TESTICLES TECHNIQUE: Complete ultrasound examination of the testicles, epididymis, and other scrotal structures was performed. Color and spectral Doppler ultrasound were also  utilized to evaluate blood flow to the testicles. COMPARISON:  None. FINDINGS: Right testicle Measurements: 4.6 x 3.2 x 3.5 cm. No mass or microlithiasis visualized. Heterogeneous hypervascular echogenic tissue superior to the right testis. Left testicle Measurements: 5.4 x 2.6 x 3.4 cm. No mass or microlithiasis visualized. Right epididymis: Markedly enlarged and hypervascular. Cyst in the epididymal tail measuring 1.3 cm Left epididymis:  Small cyst at the head measuring 9 mm. Hydrocele:  Moderate complex right hydrocele with septations. Varicocele:  Small left varicocele. Pulsed Doppler interrogation of both testes demonstrates normal low resistance arterial and venous waveforms bilaterally. IMPRESSION: 1. Negative for testicular torsion 2. Markedly enlarged and hyperemic right epididymis consistent with epididymitis. Moderate complex right hydrocele, possible pyocele. 3. Echogenic soft tissue superior to the right testis, possibly represents edematous spermatic cord, although could consider Nevin Grizzle hernia. CT could be obtained to exclude bowel containing hernia in the right inguinal region. Electronically Signed   By: Jasmine PangKim  Fujinaga M.D.   On: 12/13/2018 14:21         Scheduled Meds: . enoxaparin (LOVENOX) injection  40 mg Subcutaneous Q24H  . guaiFENesin  600 mg Oral BID  . loratadine  10 mg Oral Daily  . nicotine  21 mg Transdermal Daily   Continuous Infusions: . sodium chloride 100 mL/hr at 12/14/18 1111  . levofloxacin (LEVAQUIN) IV 100 mL/hr at 12/14/18 0100     LOS: 1 day    Time spent: over 30 min    Lacretia Nicksaldwell Powell, MD [facility] Pager [phone]  If 7PM-7AM, please contact night-coverage [email or web address] Password [facility] 12/14/2018, 7:43 PM

## 2018-12-15 LAB — CBC
HCT: 41.3 % (ref 39.0–52.0)
Hemoglobin: 12.9 g/dL — ABNORMAL LOW (ref 13.0–17.0)
MCH: 29.9 pg (ref 26.0–34.0)
MCHC: 31.2 g/dL (ref 30.0–36.0)
MCV: 95.8 fL (ref 80.0–100.0)
Platelets: 243 10*3/uL (ref 150–400)
RBC: 4.31 MIL/uL (ref 4.22–5.81)
RDW: 12.7 % (ref 11.5–15.5)
WBC: 12.1 10*3/uL — ABNORMAL HIGH (ref 4.0–10.5)
nRBC: 0 % (ref 0.0–0.2)

## 2018-12-15 LAB — COMPREHENSIVE METABOLIC PANEL
ALT: 14 U/L (ref 0–44)
AST: 15 U/L (ref 15–41)
Albumin: 3.3 g/dL — ABNORMAL LOW (ref 3.5–5.0)
Alkaline Phosphatase: 68 U/L (ref 38–126)
Anion gap: 12 (ref 5–15)
BUN: 14 mg/dL (ref 6–20)
CO2: 18 mmol/L — ABNORMAL LOW (ref 22–32)
Calcium: 8.3 mg/dL — ABNORMAL LOW (ref 8.9–10.3)
Chloride: 108 mmol/L (ref 98–111)
Creatinine, Ser: 0.94 mg/dL (ref 0.61–1.24)
GFR calc Af Amer: >60 mL/min (ref >60)
GFR calc non Af Amer: >60 mL/min (ref >60)
GLUCOSE: 92 mg/dL (ref 70–99)
Potassium: 4 mmol/L (ref 3.5–5.1)
Sodium: 138 mmol/L (ref 135–145)
Total Bilirubin: 0.4 mg/dL (ref 0.3–1.2)
Total Protein: 6.5 g/dL (ref 6.5–8.1)

## 2018-12-15 LAB — MAGNESIUM: Magnesium: 2.1 mg/dL (ref 1.7–2.4)

## 2018-12-15 MED ORDER — HYDROCODONE-ACETAMINOPHEN 5-325 MG PO TABS: 1.0000 | ORAL_TABLET | ORAL | 0 refills | Status: AC | PRN

## 2018-12-15 MED ORDER — TAMSULOSIN HCL 0.4 MG PO CAPS: 0.4000 mg | ORAL_CAPSULE | Freq: Every day | ORAL | 0 refills | Status: AC

## 2018-12-15 MED ORDER — LEVOFLOXACIN 750 MG PO TABS: 750.0000 mg | ORAL_TABLET | Freq: Every day | ORAL | 0 refills | Status: AC

## 2018-12-15 NOTE — Plan of Care (Signed)
Discharge instructions reviewed with patient, questions answered, verbalized understanding.  Stressed to patient the importance of finishing complete course of antibiotics and following up with urology.  Patient verbalized understanding.  Patient ambulatory to front entrance of hospital to be taken back to  his vehicle at [facility] by taxi (taxi voucher given to [employer] driver by this Charity fundraiserN).

## 2018-12-15 NOTE — Progress Notes (Addendum)
Provided taxi voucher for transport [facility] [place] where patient's truck is parked. No other transportation options available to patient at this time. Left voucher with RN, Herbert SetaHeather.   Joel CutterLindsey Nyema Morton, MSW, LCSWA Clinical Social Work [phone]

## 2018-12-15 NOTE — Discharge Summary (Signed)
Physician Discharge Summary  Joel MeyerDavid Fedie MWU:[id number] DOB: 1961-06-23 DOA: 12/13/2018  PCP: Patient, No Pcp Per  Admit date: 12/13/2018 Discharge date: 12/15/2018  Admitted From: Home  Disposition:  Home   Recommendations for Outpatient Follow-up:  1. Follow up with PCP in 1-2 weeks 2. Please obtain BMP/CBC in one week your next doctors visit.  3. Levaquin for 3 weeks 4. Follow-up with outpatient neurology in 3-4 weeks  Home Health: Equipment/Devices: Discharge Condition: Stable CODE STATUS: Full code Diet recommendation: Regular  Brief/Interim Summary: 57 year old with history of previous multiple UTI came to the hospital complains of difficulty urinating.  He reported of suprapubic abdominal tenderness and swelling of the right testicle.  He was found to be septic secondary to urinary tract infection and right epididymitis. C ase was discussed with on-call urologist Dr. Sande BrothersWinters who recommended treating it with 3 weeks of Levaquin and outpatient follow-up. Stable for discharge today with outpatient follow-up  Discharge Diagnoses:  Principal Problem:   Sepsis (HCC) Active Problems:   Right epididymitis   Acute lower UTI   Hyponatremia   Tobacco abuse   Renal insufficiency  Sepsis secondary to urinary tract infection and right epididymitis -This is greatly improved.  Case discussed with urologist by the admitting provider.  Dr. Normajean GlasgowVenters recommended treating with 3 weeks of oral Levaquin and follow-up outpatient.  Scrotal support was given.  Pain medications were prescribed.  Was advised to follow-up outpatient with primary care provider and urologist. Patient symptoms significantly improved quicker than expected and was wanting to go home due to the holidays.  Renal insufficiency-improved  Sinus congestion- likely viral but should also be covered with Levaquin in case if it is bacterial.  Supportive care  Tobacco use -Counseled to quit using this.  Patient was on  Lovenox while he was here Full code Discharge today  Discharge Instructions   Allergies as of 12/15/2018   No Known Allergies     Medication List    TAKE these medications   HYDROcodone-acetaminophen 5-325 MG tablet Commonly known as:  NORCO/VICODIN Take 1 tablet by mouth every 4 (four) hours as needed for up to 3 days for moderate pain.   levofloxacin 750 MG tablet Commonly known as:  LEVAQUIN Take 1 tablet (750 mg total) by mouth daily for 20 days.   tamsulosin 0.4 MG Caps capsule Commonly known as:  FLOMAX Take 1 capsule (0.4 mg total) by mouth daily after supper.      Follow-up Information    [facility]. Schedule an appointment as soon as possible for a visit in 1 week(s).   Contact information: [address] [place] [phone]       Rene PaciWinter, Christopher Aaron, MD. Schedule an appointment as soon as possible for a visit in 3 week(s).   Specialty:  Urology Contact information: [address] [place] [phone]          No Known Allergies  You were cared for by a hospitalist during your hospital stay. If you have any questions about your discharge medications or the care you received while you were in the hospital after you are discharged, you can call the unit and asked to speak with the hospitalist on call if the hospitalist that took care of you is not available. Once you are discharged, your primary care physician will handle any further medical issues. Please note that no refills for any discharge medications will be authorized once you are  discharged, as it is imperative that you return to your primary care physician (or establish a relationship with a primary care physician if you do not have one) for your aftercare needs so that they can reassess your need for medications and monitor your lab values.  Consultations:  Curbside urology, Dr. Sande Brothers by the admitting  provider   Procedures/Studies: Ct Abdomen Pelvis W Contrast  Result Date: 12/13/2018 CLINICAL DATA:  57 y/o  M; 2 days of right testicle pain. EXAM: CT ABDOMEN AND PELVIS WITH CONTRAST TECHNIQUE: Multidetector CT imaging of the abdomen and pelvis was performed using the standard protocol following bolus administration of intravenous contrast. CONTRAST:  ISOVUE-300 IOPAMIDOL (ISOVUE-300) INJECTION 61% COMPARISON:  12/13/2018 scrotum ultrasound. FINDINGS: Lower chest: No acute abnormality. Hepatobiliary: No focal liver abnormality is seen. Status post cholecystectomy. No biliary dilatation. Pancreas: Unremarkable. No pancreatic ductal dilatation or surrounding inflammatory changes. Spleen: Normal in size without focal abnormality. Adrenals/Urinary Tract: Adrenal glands are unremarkable. Kidneys are normal, without renal calculi, focal lesion, or hydronephrosis. Diffuse bladder wall thickening. Stomach/Bowel: Stomach is within normal limits. Appendix appears normal. No evidence of bowel wall thickening, distention, or inflammatory changes. Vascular/Lymphatic: Aortic atherosclerosis. No enlarged abdominal or pelvic lymph nodes. Reproductive: Inflammation within the pelvis surrounding seminal vesicles and prostate extending along course of the enhancing right spermatic cord into the right inguinal canal. Other: No abdominal wall hernia or abnormality. No abdominopelvic ascites. Musculoskeletal: No acute or significant osseous findings. IMPRESSION: 1. Inflammation within the pelvis surrounding seminal vesicles and prostate extending along course of the enhancing right spermatic cord into the right inguinal canal. Epididymitis on same-day scrotal ultrasound. Findings likely represent infection of the genital system. 2. Diffuse bladder wall thickening may be reactive, reflect cystitis, or chronic outflow obstruction. Electronically Signed   By: Mitzi Hansen M.D.   On: 12/13/2018 15:28   US Scrotum  W/doppler  Result Date: 12/13/2018 CLINICAL DATA:  Right testicle pain for 2 days with decreased urine output EXAM: SCROTAL ULTRASOUND DOPPLER ULTRASOUND OF THE TESTICLES TECHNIQUE: Complete ultrasound examination of the testicles, epididymis, and other scrotal structures was performed. Color and spectral Doppler ultrasound were also utilized to evaluate blood flow to the testicles. COMPARISON:  None. FINDINGS: Right testicle Measurements: 4.6 x 3.2 x 3.5 cm. No mass or microlithiasis visualized. Heterogeneous hypervascular echogenic tissue superior to the right testis. Left testicle Measurements: 5.4 x 2.6 x 3.4 cm. No mass or microlithiasis visualized. Right epididymis: Markedly enlarged and hypervascular. Cyst in the epididymal tail measuring 1.3 cm Left epididymis:  Small cyst at the head measuring 9 mm. Hydrocele:  Moderate complex right hydrocele with septations. Varicocele:  Small left varicocele. Pulsed Doppler interrogation of both testes demonstrates normal low resistance arterial and venous waveforms bilaterally. IMPRESSION: 1. Negative for testicular torsion 2. Markedly enlarged and hyperemic right epididymis consistent with epididymitis. Moderate complex right hydrocele, possible pyocele. 3. Echogenic soft tissue superior to the right testis, possibly represents edematous spermatic cord, although could consider a hernia. CT could be obtained to exclude bowel containing hernia in the right inguinal region. Electronically Signed   By: Jasmine Pang M.D.   On: 12/13/2018 14:21      Subjective: No complaints, feels better.  Wants to go home   Discharge Exam: Vitals:   12/14/18 2101 12/15/18 0526  BP: 105/75 105/70  Pulse: 86 87  Resp: 18 12  Temp: 99 F (37.2 C) 98.3 F (36.8 C)  SpO2: 99% 98%   Vitals:   12/14/18 0553 12/14/18 1338  12/14/18 2101 12/15/18 0526  BP: 96/68 115/74 105/75 105/70  Pulse: 96 91 86 87  Resp: 20 20 18 12   Temp: 99.6 F (37.6 C) 99.8 F (37.7 C) 99 F  (37.2 C) 98.3 F (36.8 C)  TempSrc: Oral Oral Oral Oral  SpO2: 98% 100% 99% 98%  Weight:      Height:        General: Pt is alert, awake, not in acute distress Cardiovascular: RRR, S1/S2 +, no rubs, no gallops Respiratory: CTA bilaterally, no wheezing, no rhonchi Abdominal: Soft, NT, ND, bowel sounds + Extremities: no edema, no cyanosis    The results of significant diagnostics from this hospitalization (including imaging, microbiology, ancillary and laboratory) are listed below for reference.     Microbiology: Recent Results (from the past 240 hour(s))  Urine culture     Status: None   Collection Time: 12/13/18 12:26 PM  Result Value Ref Range Status   Specimen Description   Final    URINE, CLEAN CATCH Performed at [facility] [place], [address] [place], [place] [address]    Special Requests   Final    NONE Performed at [facility] [place], [address] [place], [place] [address]    Culture   Final    NO GROWTH Performed at [facility], [address] [place], [place] [address]    Report Status 12/14/2018 FINAL  Final  Blood Culture (routine x 2)     Status: None (Preliminary result)   Collection Time: 12/13/18  1:20 PM  Result Value Ref Range Status   Specimen Description   Final    BLOOD RIGHT ARM Performed at [facility] [place] [facility], [address] [place], [place] [address]    Special Requests   Final    BOTTLES DRAWN AEROBIC AND ANAEROBIC Blood Culture adequate volume Performed at [facility] [place], [address] [place], [place] [address]    Culture   Final    NO GROWTH 2 DAYS Performed at [facility], [address] [place], [place] [address]    Report Status PENDING  Incomplete  Blood Culture (routine x 2)     Status: None (Preliminary result)   Collection Time: 12/13/18  1:35 PM  Result Value Ref Range Status   Specimen Description   Final    BLOOD LEFT HAND Performed at [facility] [place], [address] [place], [place] [address]    Special Requests   Final    BOTTLES DRAWN AEROBIC AND ANAEROBIC Blood Culture adequate volume Performed at [facility] [place], [address] [place], [place] [address]    Culture   Final    NO GROWTH 2 DAYS Performed at [facility], [address] [place], [place] [address]    Report Status PENDING  Incomplete  MRSA PCR Screening     Status: None   Collection Time: 12/13/18  7:12 PM  Result Value Ref Range Status   MRSA by PCR NEGATIVE NEGATIVE Final    Comment:        The GeneXpert MRSA Assay (FDA approved for NASAL specimens only), is one component of a comprehensive MRSA colonization surveillance program. It is not intended to diagnose MRSA infection nor to guide or monitor treatment for MRSA infections. Performed at [facility], [address] [place], [place] [address]      Labs: BNP (last 3 results) No  results for input(s): BNP in the last 8760 hours. Basic Metabolic Panel: Recent Labs  Lab 12/13/18 1326 12/14/18 0452 12/15/18 0457  NA 131* 134* 138  K 4.2 4.0 4.0  CL 98 103 108  CO2 24 15* 18*  GLUCOSE 105* 98 92  BUN 18 15 14   CREATININE 1.26* 0.98 0.94  CALCIUM 9.0 8.3* 8.3*  MG  --   --  2.1   Liver Function Tests: Recent Labs  Lab 12/13/18 1326 12/15/18 0457  AST 14* 15  ALT 12 14  ALKPHOS 84 68  BILITOT 1.0 0.4  PROT 6.9 6.5  ALBUMIN 4.2 3.3*   No results for input(s): LIPASE, AMYLASE in the last 168 hours. No results for input(s): AMMONIA in the last 168 hours. CBC: Recent Labs  Lab 12/13/18 1326 12/14/18 0452 12/15/18 0457  WBC 23.5* 18.7* 12.1*  NEUTROABS 20.6*  --   --   HGB 14.1 12.9* 12.9*  HCT 44.0 40.1 41.3  MCV 92.1 92.6 95.8  PLT 307 287 243   Cardiac Enzymes: No results for input(s): CKTOTAL, CKMB, CKMBINDEX, TROPONINI in the last 168 hours. BNP: Invalid input(s): POCBNP CBG: No results for input(s): GLUCAP in the last 168 hours. D-Dimer No  results for input(s): DDIMER in the last 72 hours. Hgb A1c No results for input(s): HGBA1C in the last 72 hours. Lipid Profile No results for input(s): CHOL, HDL, LDLCALC, TRIG, CHOLHDL, LDLDIRECT in the last 72 hours. Thyroid function studies No results for input(s): TSH, T4TOTAL, T3FREE, THYROIDAB in the last 72 hours.  Invalid input(s): FREET3 Anemia work up No results for input(s): VITAMINB12, FOLATE, FERRITIN, TIBC, IRON, RETICCTPCT in the last 72 hours. Urinalysis    Component Value Date/Time   COLORURINE YELLOW 12/13/2018 1226   APPEARANCEUR CLOUDY (A) 12/13/2018 1226   LABSPEC 1.020 12/13/2018 1226   PHURINE 5.5 12/13/2018 1226   GLUCOSEU NEGATIVE 12/13/2018 1226   HGBUR LARGE (A) 12/13/2018 1226   BILIRUBINUR SMALL (A) 12/13/2018 1226   KETONESUR 15 (A) 12/13/2018 1226   PROTEINUR 100 (A) 12/13/2018 1226   NITRITE NEGATIVE 12/13/2018 1226   LEUKOCYTESUR LARGE (A) 12/13/2018 1226   Sepsis Labs Invalid input(s): PROCALCITONIN,  WBC,  LACTICIDVEN Microbiology Recent Results (from the past 240 hour(s))  Urine culture     Status: None   Collection Time: 12/13/18 12:26 PM  Result Value Ref Range Status   Specimen Description   Final    URINE, CLEAN CATCH Performed at [facility], [address] [place], [place] [address]    Special Requests   Final    NONE Performed at [facility], [address] [place], [place] [address]    Culture   Final    NO GROWTH Performed at [facility], [address] [place], [place] [address]    Report Status 12/14/2018 FINAL  Final  Blood Culture (routine x 2)     Status: None (Preliminary result)   Collection Time: 12/13/18  1:20 PM  Result Value Ref Range Status   Specimen Description   Final    BLOOD RIGHT ARM Performed at [facility] [address] [place], [place] [address]    Special Requests   Final    BOTTLES DRAWN AEROBIC AND ANAEROBIC Blood Culture adequate  volume Performed at [facility], [address] [place], [place] [address]    Culture   Final    NO GROWTH 2 DAYS Performed at  [facility], [address], [place], [place] [address]    Report Status PENDING  Incomplete  Blood Culture (routine x 2)     Status: None (Preliminary result)   Collection Time: 12/13/18  1:35 PM  Result Value Ref Range Status   Specimen Description   Final    BLOOD LEFT HAND Performed at [facility], [address] [place], [place] [address]    Special Requests   Final    BOTTLES DRAWN AEROBIC AND ANAEROBIC Blood Culture adequate volume Performed at [facility], [address] [place], [place] [address]    Culture   Final    NO GROWTH 2 DAYS Performed at [facility], [address] [place], [place] [address]    Report Status PENDING  Incomplete  MRSA PCR Screening     Status: None   Collection Time: 12/13/18  7:12 PM  Result Value Ref Range Status   MRSA by PCR NEGATIVE NEGATIVE Final    Comment:        The GeneXpert MRSA Assay (FDA approved for NASAL specimens only), is one component of a comprehensive MRSA colonization surveillance program. It is not intended to diagnose MRSA infection nor to guide or monitor treatment for MRSA infections. Performed at [facility], [address] [place], [place] [address]      Time coordinating discharge:  I have spent 35 minutes face to face with the patient and on the ward discussing the patients care, assessment, plan and disposition with other care givers. >50% of the time was devoted counseling the patient about the risks and benefits of treatment/Discharge disposition and coordinating care.   SIGNED:   Dimple Nanas, MD  [facility] 12/15/2018, 10:46 AM Pager   If 7PM-7AM, please contact night-coverage [email or web address] Password [email or web address]

## 2018-12-16 LAB — GC/CHLAMYDIA PROBE AMP (~~LOC~~) NOT AT ARMC
Chlamydia: NEGATIVE
Neisseria Gonorrhea: NEGATIVE

## 2018-12-18 LAB — CULTURE, BLOOD (ROUTINE X 2)
Culture: NO GROWTH
Culture: NO GROWTH
Special Requests: ADEQUATE
Special Requests: ADEQUATE

## 2018-12-22 NOTE — Progress Notes (Signed)
Subjective:    Patient ID: Joel Morton, male    DOB: 02-15-1961, 57 y.o.   MRN: [id number]  57 y.o.M s/p adm for sepsis d/t epididymitis 12/20 to 12/15/18  This patient came to the hospital with difficulty urinating with increased hematuria on 12/13/2018 and was found to have acute sepsis along with acute renal insufficiency and was admitted for treatment.  Work-up revealed a pyocele along with epididymitis with spermatic cord inflammation seminal vesicle inflammation along with prostate inflammation in the pelvis.  The patient was not physically seen by urology but a telephone consult recommended 3 weeks of Levaquin and follow-up in the office as an outpatient.  The patient's primary care physician is at his place of work.  Patient is an active smoker and has been diagnosed with COPD in the past but does not take inhalers on a regular basis.  Since discharge the pain in the right groin area and scrotal area is better but still present.  He denies any fever.  There are some sweats.  He denies any other your specific urologic complaints.  He has no difficulty urinating at this time.    Past Medical History:  Diagnosis Date  . Hemorrhoid   . UTI (urinary tract infection)      History reviewed. No pertinent family history.   Social History   Socioeconomic History  . Marital status: Legally Separated    Spouse name: Not on file  . Number of children: Not on file  . Years of education: Not on file  . Highest education level: Not on file  Occupational History  . Not on file  Social Needs  . Financial resource strain: Not on file  . Food insecurity:    Worry: Not on file    Inability: Not on file  . Transportation needs:    Medical: Not on file    Non-medical: Not on file  Tobacco Use  . Smoking status: Current Every Day Smoker    Types: Cigarettes  . Smokeless tobacco: Never Used  Substance and Sexual Activity  . Alcohol use: Never    Frequency: Never  . Drug use: Never    . Sexual activity: Not Currently  Lifestyle  . Physical activity:    Days per week: Not on file    Minutes per session: Not on file  . Stress: Not on file  Relationships  . Social connections:    Talks on phone: Not on file    Gets together: Not on file    Attends religious service: Not on file    Active member of club or organization: Not on file    Attends meetings of clubs or organizations: Not on file    Relationship status: Not on file  . Intimate partner violence:    Fear of current or ex partner: Not on file    Emotionally abused: Not on file    Physically abused: Not on file    Forced sexual activity: Not on file  Other Topics Concern  . Not on file  Social History Narrative  . Not on file     No Known Allergies   Outpatient Medications Prior to Visit  Medication Sig Dispense Refill  . levofloxacin (LEVAQUIN) 750 MG tablet Take 1 tablet (750 mg total) by mouth daily for 20 days. 20 tablet 0  . tamsulosin (FLOMAX) 0.4 MG CAPS capsule Take 1 capsule (0.4 mg total) by mouth daily after supper. 30 capsule 0   No facility-administered medications prior to  visit.      Review of Systems  Constitutional: Positive for diaphoresis. Negative for appetite change, chills and fever.  HENT: Negative.   Genitourinary: Positive for scrotal swelling and testicular pain. Negative for decreased urine volume, difficulty urinating, discharge, dysuria, enuresis, flank pain, frequency, genital sores, hematuria, penile pain, penile swelling and urgency.  Hematological: Does not bruise/bleed easily.       Objective:   Physical Exam Vitals:   12/23/18 1335  BP: 110/72  Pulse: 95  Resp: 18  Temp: 98.5 F (36.9 C)  TempSrc: Oral  SpO2: 96%  Weight: 237 lb (107.5 kg)  Height: 6\' 4"  (1.93 m)    Gen: Pleasant, well-nourished, in no distress,  normal affect  ENT: No lesions,  mouth clear,  oropharynx clear, no postnasal drip  Neck: No JVD, no TMG, no carotid bruits  Lungs:  No use of accessory muscles, no dullness to percussion, clear without rales or rhonchi  Cardiovascular: RRR, heart sounds normal, no murmur or gallops, no peripheral edema  Abdomen: soft and NT, no HSM,  BS normal  Musculoskeletal: No deformities, no cyanosis or clubbing  Neuro: alert, non focal  Skin: Warm, no lesions or rashes  GU: Right scrotal area is edematous and tender and also the right inguinal area is tender as well  U/A: neg nitrates, neg leukocytes.  Mod Blood.  No Glucose  12/20 ABD CT: IMPRESSION: 1. Inflammation within the pelvis surrounding seminal vesicles and prostate extending along course of the enhancing right spermatic cord into the right inguinal canal. Epididymitis on same-day scrotal ultrasound. Findings likely represent infection of the genital system. 2. Diffuse bladder wall thickening may be reactive, reflect cystitis, or chronic outflow obstruction.  12/20 U/S scrotum IMPRESSION: 1. Negative for testicular torsion 2. Markedly enlarged and hyperemic right epididymis consistent with epididymitis. Moderate complex right hydrocele, possible pyocele. 3. Echogenic soft tissue superior to the right testis, possibly represents edematous spermatic cord, although could consider a hernia. CT could be obtained to exclude bowel containing hernia in the right inguinal region.  BMP Latest Ref Rng & Units 12/15/2018 12/14/2018 12/13/2018  Glucose 70 - 99 mg/dL 92 98 811(B105(H)  BUN 6 - 20 mg/dL 14 15 18   Creatinine 0.61 - 1.24 mg/dL 1.470.94 8.290.98 5.62(Z1.26(H)  Sodium 135 - 145 mmol/L 138 134(L) 131(L)  Potassium 3.5 - 5.1 mmol/L 4.0 4.0 4.2  Chloride 98 - 111 mmol/L 108 103 98  CO2 22 - 32 mmol/L 18(L) 15(L) 24  Calcium 8.9 - 10.3 mg/dL 8.3(L) 8.3(L) 9.0   CBC Latest Ref Rng & Units 12/15/2018 12/14/2018 12/13/2018  WBC 4.0 - 10.5 K/uL 12.1(H) 18.7(H) 23.5(H)  Hemoglobin 13.0 - 17.0 g/dL 12.9(L) 12.9(L) 14.1  Hematocrit 39.0 - 52.0 % 41.3 40.1 44.0  Platelets 150 -  400 K/uL 243 287 307         Assessment & Plan:  I personally reviewed all images and lab data in [facility] as well as any outside material available during this office visit and agree with the  radiology impressions.   Right epididymitis Acute right epididymitis with associated pyelocoele versus fluid collection in the right scrotal area and associated inflammation in the right inguinal canal involving the scrotal cord and prostate and seminal vesicles  The patient had severe sepsis with this condition and is improving on oral antibiotics  The patient still is showing hematuria on urinalysis at this visit  Plan Referral to urology ASAP for follow-up of the epididymitis and associated seminal vesicle  inflammatory process and associated fluid collection in the right scrotum  Patient to continue current course of Levaquin daily and associated Flomax daily  A BMP and CBC will be obtained today The patient has requested to follow-up with his primary care physician that he sees at his work medical clinic  Acute lower UTI Acute lower urinary tract infection appears to be improved on current UA  Plan Continue course of Levaquin  Sepsis (HCC) Sepsis syndrome has resolved  Hyponatremia Hyponatremia associated with sepsis syndrome  We will follow-up BMP today  Tobacco dependency The patient was counseled to consider smoking cessation   Hematuria History of hematuria which is continuing  This is likely related to the epididymitis and associated inflammatory state in the pelvis  Renal insufficiency Renal insufficiency This will be followed up with a BMP to check today   Joel Morton was seen today for hospitalization follow-up.  Diagnoses and all orders for this visit:  Right epididymitis -     Ambulatory referral to Urology -     CBC with Differential/Platelet; Future -     Basic metabolic panel; Future -     POCT URINALYSIS DIP (CLINITEK) -     Basic metabolic  panel -     CBC with Differential/Platelet  Acute lower UTI -     Ambulatory referral to Urology -     CBC with Differential/Platelet; Future -     Basic metabolic panel; Future -     POCT URINALYSIS DIP (CLINITEK) -     Basic metabolic panel -     CBC with Differential/Platelet  Other microscopic hematuria  Sepsis, due to unspecified organism, unspecified whether acute organ dysfunction present (HCC)  Hyponatremia  Tobacco dependency  Renal insufficiency

## 2018-12-23 ENCOUNTER — Encounter: Payer: Self-pay | Admitting: Critical Care Medicine

## 2018-12-23 ENCOUNTER — Ambulatory Visit: Payer: BLUE CROSS/BLUE SHIELD | Attending: Critical Care Medicine | Admitting: Critical Care Medicine

## 2018-12-23 VITALS — BP 110/72 | HR 95 | Temp 98.5°F | Resp 18 | Ht 76.0 in | Wt 237.0 lb

## 2018-12-23 DIAGNOSIS — E871 Hypo-osmolality and hyponatremia: Secondary | ICD-10-CM | POA: Insufficient documentation

## 2018-12-23 DIAGNOSIS — N289 Disorder of kidney and ureter, unspecified: Secondary | ICD-10-CM

## 2018-12-23 DIAGNOSIS — F17200 Nicotine dependence, unspecified, uncomplicated: Secondary | ICD-10-CM

## 2018-12-23 DIAGNOSIS — Z79899 Other long term (current) drug therapy: Secondary | ICD-10-CM | POA: Insufficient documentation

## 2018-12-23 DIAGNOSIS — N390 Urinary tract infection, site not specified: Secondary | ICD-10-CM | POA: Insufficient documentation

## 2018-12-23 DIAGNOSIS — J449 Chronic obstructive pulmonary disease, unspecified: Secondary | ICD-10-CM | POA: Insufficient documentation

## 2018-12-23 DIAGNOSIS — A419 Sepsis, unspecified organism: Secondary | ICD-10-CM | POA: Insufficient documentation

## 2018-12-23 DIAGNOSIS — R3129 Other microscopic hematuria: Secondary | ICD-10-CM | POA: Diagnosis not present

## 2018-12-23 DIAGNOSIS — N2889 Other specified disorders of kidney and ureter: Secondary | ICD-10-CM

## 2018-12-23 DIAGNOSIS — N451 Epididymitis: Secondary | ICD-10-CM | POA: Insufficient documentation

## 2018-12-23 DIAGNOSIS — F17210 Nicotine dependence, cigarettes, uncomplicated: Secondary | ICD-10-CM | POA: Insufficient documentation

## 2018-12-23 DIAGNOSIS — R319 Hematuria, unspecified: Secondary | ICD-10-CM | POA: Insufficient documentation

## 2018-12-23 LAB — POCT URINALYSIS DIP (CLINITEK)
BILIRUBIN UA: NEGATIVE
Glucose, UA: NEGATIVE mg/dL
Ketones, POC UA: NEGATIVE mg/dL
Leukocytes, UA: NEGATIVE
Nitrite, UA: NEGATIVE
PH UA: 5.5 (ref 5.0–8.0)
POC PROTEIN,UA: NEGATIVE
Spec Grav, UA: <=1.005 — AB (ref 1.010–1.025)
Urobilinogen, UA: 0.2 E.U./dL

## 2018-12-23 NOTE — Assessment & Plan Note (Signed)
Renal insufficiency This will be followed up with a BMP to check today

## 2018-12-23 NOTE — Assessment & Plan Note (Signed)
Hyponatremia associated with sepsis syndrome  We will follow-up BMP today

## 2018-12-23 NOTE — Patient Instructions (Signed)
Please continue to take the Levaquin and Flomax daily An appointment with urology will be made to follow-up the epididymitis Please follow-up with your primary care provider at your company medical office We are available as needed

## 2018-12-23 NOTE — Assessment & Plan Note (Signed)
History of hematuria which is continuing  This is likely related to the epididymitis and associated inflammatory state in the pelvis

## 2018-12-23 NOTE — Assessment & Plan Note (Signed)
Acute lower urinary tract infection appears to be improved on current UA  Plan Continue course of Levaquin

## 2018-12-23 NOTE — Assessment & Plan Note (Signed)
The patient was counseled to consider smoking cessation

## 2018-12-23 NOTE — Assessment & Plan Note (Signed)
Acute right epididymitis with associated pyelocoele versus fluid collection in the right scrotal area and associated inflammation in the right inguinal canal involving the scrotal cord and prostate and seminal vesicles  The patient had severe sepsis with this condition and is improving on oral antibiotics  The patient still is showing hematuria on urinalysis at this visit  Plan Referral to urology ASAP for follow-up of the epididymitis and associated seminal vesicle inflammatory process and associated fluid collection in the right scrotum  Patient to continue current course of Levaquin daily and associated Flomax daily  A BMP and CBC will be obtained today The patient has requested to follow-up with his primary care physician that he sees at his work medical clinic

## 2018-12-23 NOTE — Assessment & Plan Note (Signed)
Sepsis syndrome has resolved

## 2018-12-24 LAB — CBC WITH DIFFERENTIAL/PLATELET
Basophils Absolute: 0.1 10*3/uL (ref 0.0–0.2)
Basos: 1 %
EOS (ABSOLUTE): 0.2 10*3/uL (ref 0.0–0.4)
Eos: 2 %
Hematocrit: 38.4 % (ref 37.5–51.0)
Hemoglobin: 13.4 g/dL (ref 13.0–17.7)
IMMATURE GRANS (ABS): 0.1 10*3/uL (ref 0.0–0.1)
Immature Granulocytes: 1 %
LYMPHS: 22 %
Lymphocytes Absolute: 1.7 10*3/uL (ref 0.7–3.1)
MCH: 31.1 pg (ref 26.6–33.0)
MCHC: 34.9 g/dL (ref 31.5–35.7)
MCV: 89 fL (ref 79–97)
Monocytes Absolute: 0.7 10*3/uL (ref 0.1–0.9)
Monocytes: 9 %
Neutrophils Absolute: 5.1 10*3/uL (ref 1.4–7.0)
Neutrophils: 65 %
PLATELETS: 339 10*3/uL (ref 150–450)
RBC: 4.31 x10E6/uL (ref 4.14–5.80)
RDW: 11.7 % — ABNORMAL LOW (ref 12.3–15.4)
WBC: 7.8 10*3/uL (ref 3.4–10.8)

## 2018-12-24 LAB — BASIC METABOLIC PANEL
BUN/Creatinine Ratio: 8 — ABNORMAL LOW (ref 9–20)
BUN: 10 mg/dL (ref 6–24)
CALCIUM: 9.4 mg/dL (ref 8.7–10.2)
CO2: 24 mmol/L (ref 20–29)
Chloride: 101 mmol/L (ref 96–106)
Creatinine, Ser: 1.19 mg/dL (ref 0.76–1.27)
GFR calc Af Amer: 77 mL/min/{1.73_m2} (ref >59)
GFR, EST NON AFRICAN AMERICAN: 67 mL/min/{1.73_m2} (ref >59)
Glucose: 82 mg/dL (ref 65–99)
Potassium: 4.7 mmol/L (ref 3.5–5.2)
Sodium: 140 mmol/L (ref 134–144)

## 2018-12-26 NOTE — Progress Notes (Signed)
Call pt and tell him all labs ok,  His white count is lower and now normal  Stay on antibiotics and keep his appt with urology

## 2019-01-22 ENCOUNTER — Telehealth: Payer: Self-pay | Admitting: *Deleted

## 2019-01-22 NOTE — Telephone Encounter (Signed)
MA unable to reach patient for results. Patient was scheduled with [facility] for 01/07/19 and was aware of the appointment.

## 2019-01-22 NOTE — Telephone Encounter (Signed)
-----   Message from Storm Frisk, MD sent at 12/26/2018  2:11 PM EST ----- Call pt and tell him all labs ok,  His white count is lower and now normal  Stay on antibiotics and keep his appt with urology

## 2019-08-14 IMAGING — US US SCROTUM W/ DOPPLER COMPLETE
1 series · 13 of 25 positions shown · non-contrast
Comparison: None.

CLINICAL DATA: Right testicle pain for 2 days with decreased urine
output

EXAM:
SCROTAL ULTRASOUND
DOPPLER ULTRASOUND OF THE TESTICLES
TECHNIQUE: Complete ultrasound examination of the testicles, epididymis, and
other scrotal structures was performed. Color and spectral Doppler
ultrasound were also utilized to evaluate blood flow to the
testicles.

[Series 1: us scrotum w/ doppler complete · 0.12mm/px · 13 of 93 slices shown]
[im 1/93]
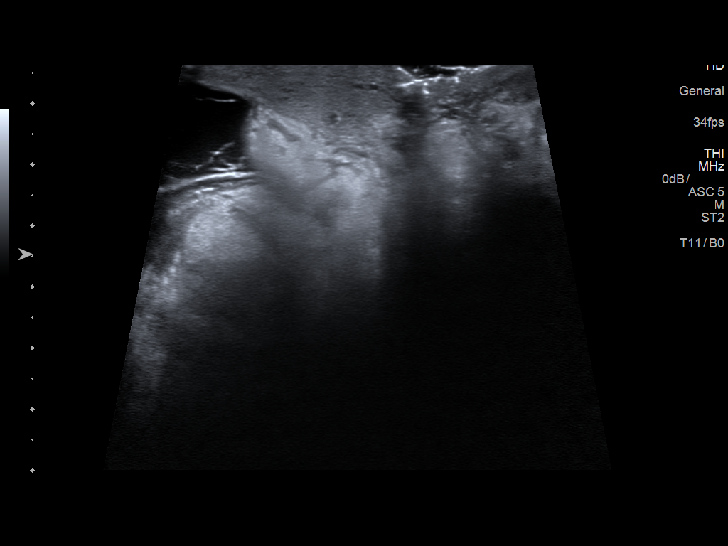
[im 8/93]
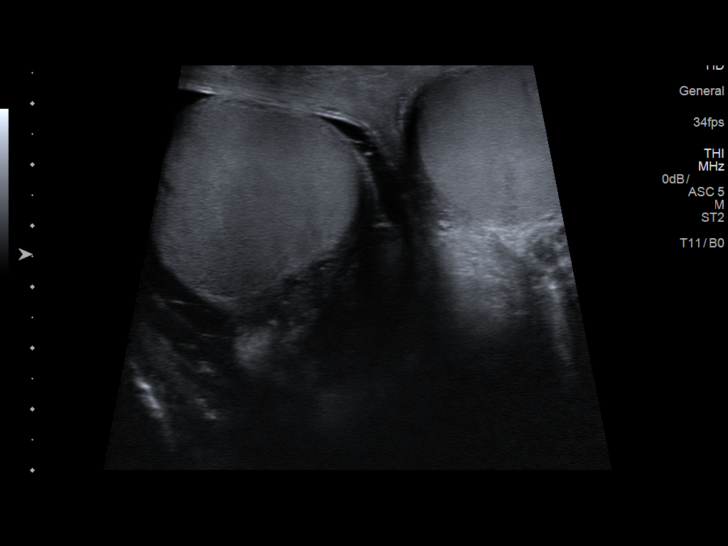
[im 16/93]
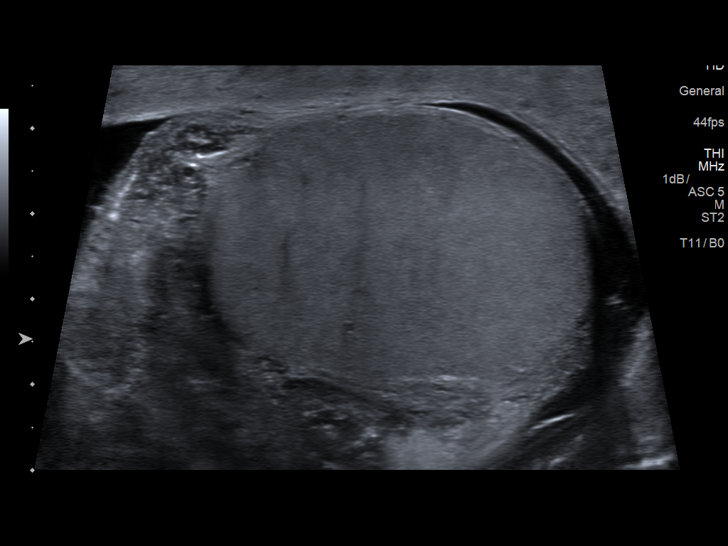
[im 24/93]
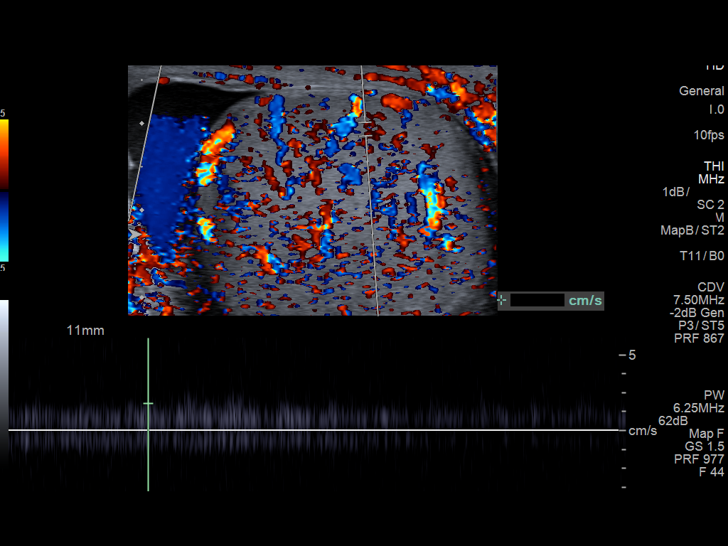
[im 31/93]
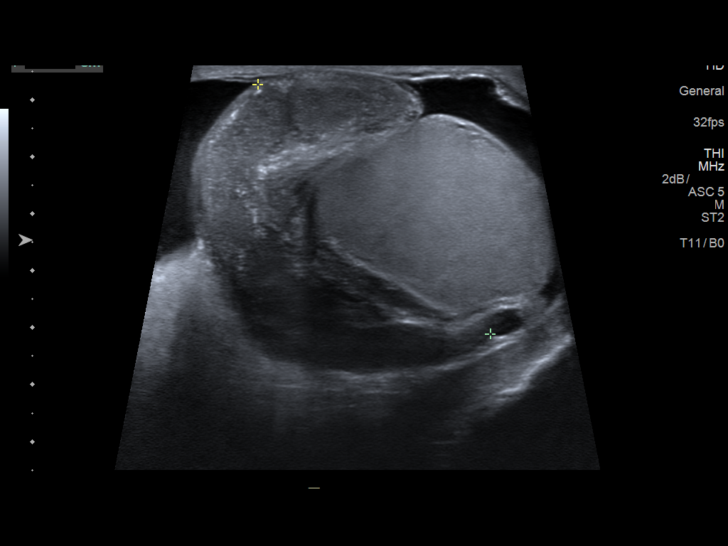
[im 39/93]
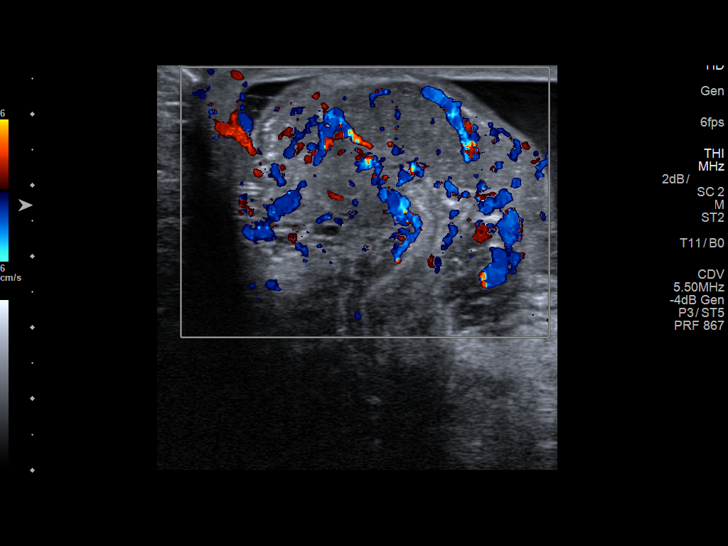
[im 47/93]
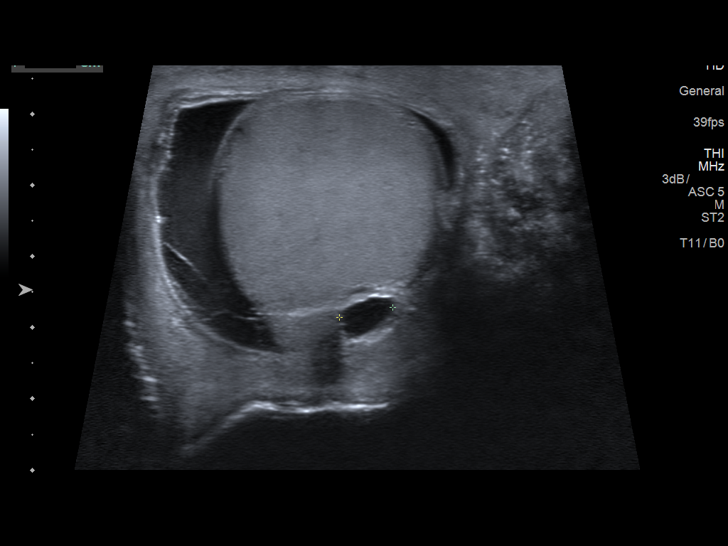
[im 54/93]
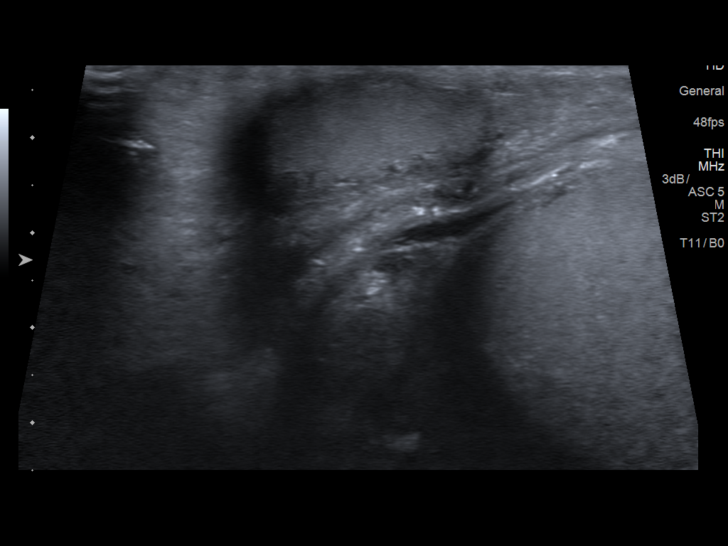
[im 62/93]
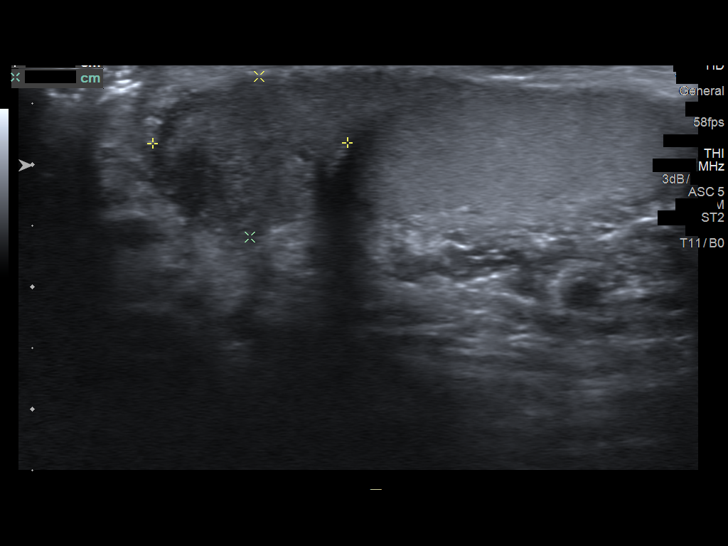
[im 70/93]
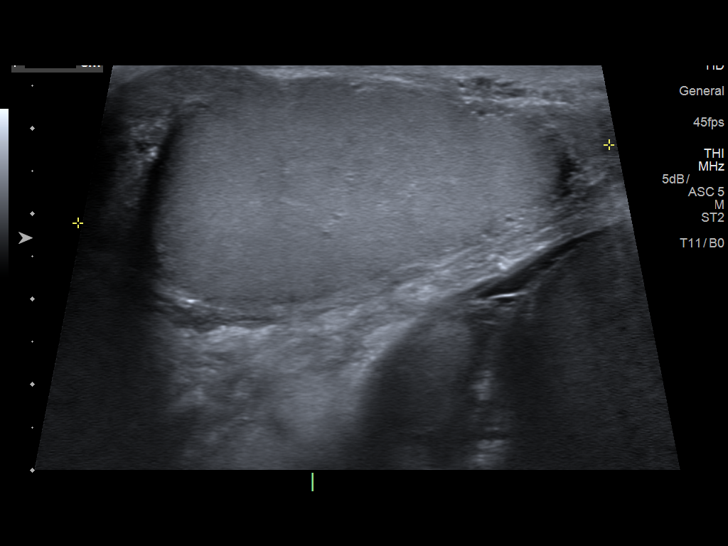
[im 77/93]
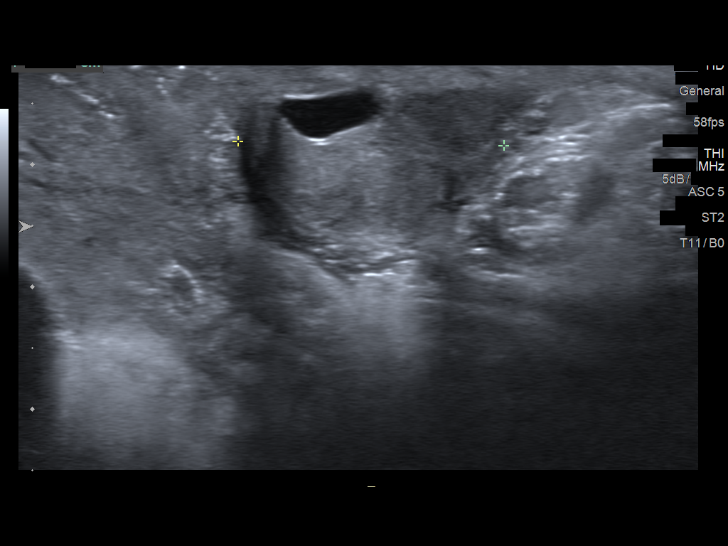
[im 85/93]
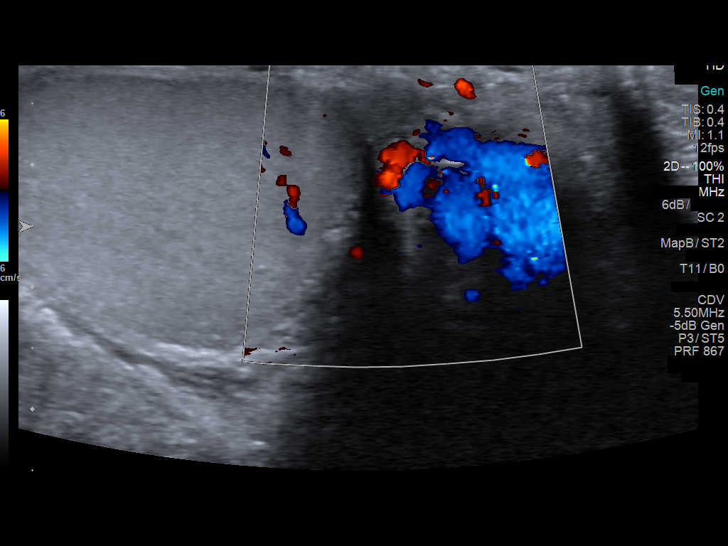
[im 93/93]
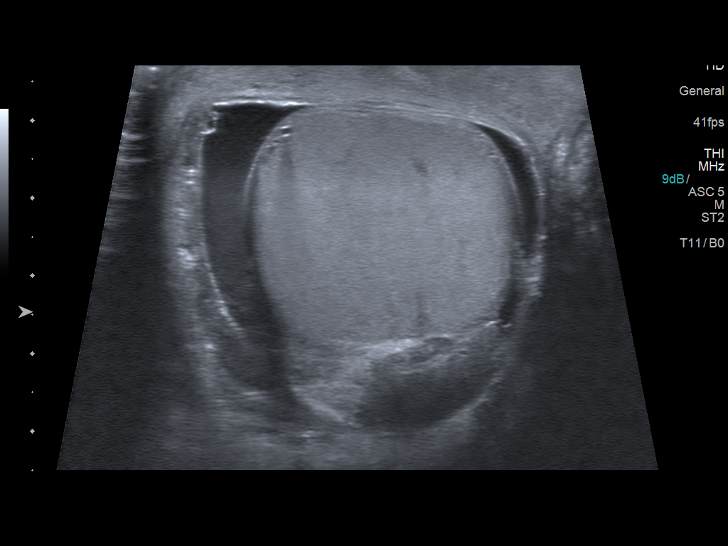

[13 of 25 positions shown; findings below may reference images not displayed]

FINDINGS: Right testicle

Measurements: 4.6 x 3.2 x 3.5 cm. No mass or microlithiasis
visualized. Heterogeneous hypervascular echogenic tissue superior to
the right testis.

Left testicle

Measurements: 5.4 x 2.6 x 3.4 cm. No mass or microlithiasis
visualized.

Right epididymis: Markedly enlarged and hypervascular. Cyst in the
epididymal tail measuring 1.3 cm

Left epididymis:  Small cyst at the head measuring 9 mm.

Hydrocele:  Moderate complex right hydrocele with septations.

Varicocele:  Small left varicocele.

Pulsed Doppler interrogation of both testes demonstrates normal low
resistance arterial and venous waveforms bilaterally.
IMPRESSION: 1. Negative for testicular torsion
2. Markedly enlarged and hyperemic right epididymis consistent with
epididymitis. Moderate complex right hydrocele, possible pyocele.
3. Echogenic soft tissue superior to the right testis, possibly
represents edematous spermatic cord, although could consider a
hernia. CT could be obtained to exclude bowel containing hernia in
the right inguinal region.

## 2020-08-17 IMAGING — CT CT ABD-PELV W/ CM
2 of 5 series · 16 of 46 positions shown, 18 images · IV contrast (APPLIED)
Comparison: 12/13/2018 scrotum ultrasound.

CLINICAL DATA: 57 y/o  M; 2 days of right testicle pain.

EXAM:
CT ABDOMEN AND PELVIS WITH CONTRAST
TECHNIQUE: Multidetector CT imaging of the abdomen and pelvis was performed
using the standard protocol following bolus administration of
intravenous contrast.
CONTRAST:  100mL UPZNBA-D11 IOPAMIDOL (UPZNBA-D11) INJECTION 61%

[Series 2: axial st · axial · 0.98mm/px · z∈[-531,-101]mm · 13 of 96 slices shown, 15 images]
[im 5/96  soft-tissue]
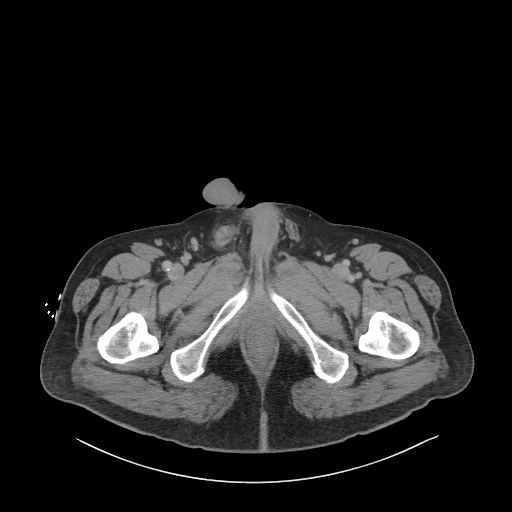
[im 5/96  bone]
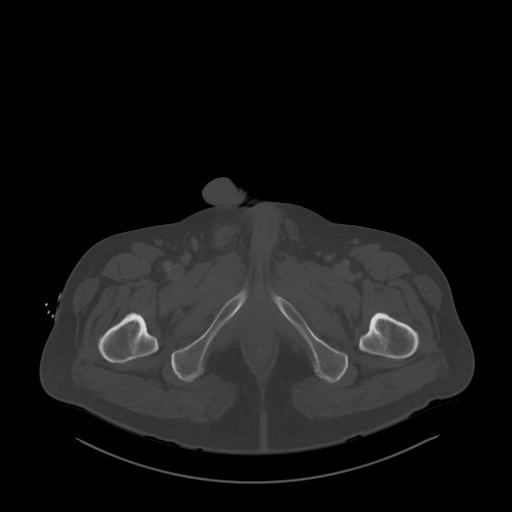
[im 15/96  soft-tissue]
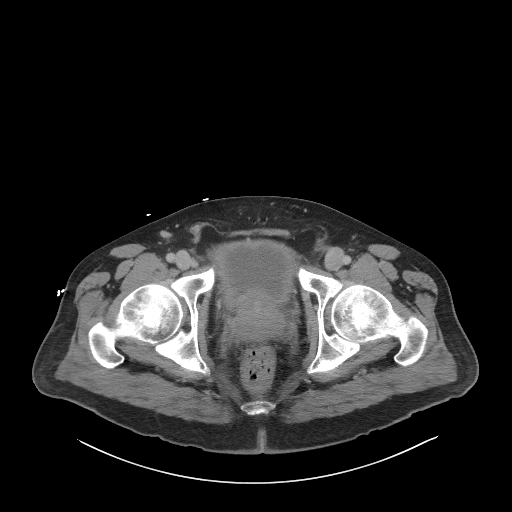
[im 20/96  soft-tissue]
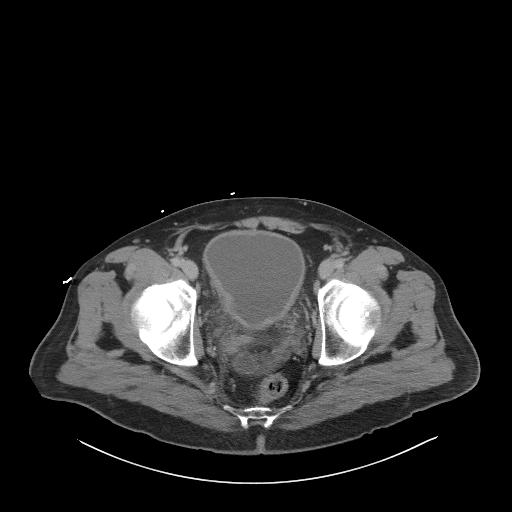
[im 29/96  soft-tissue]
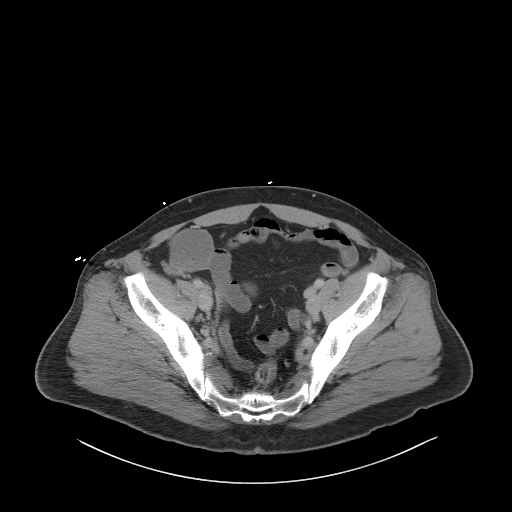
[im 34/96  soft-tissue]
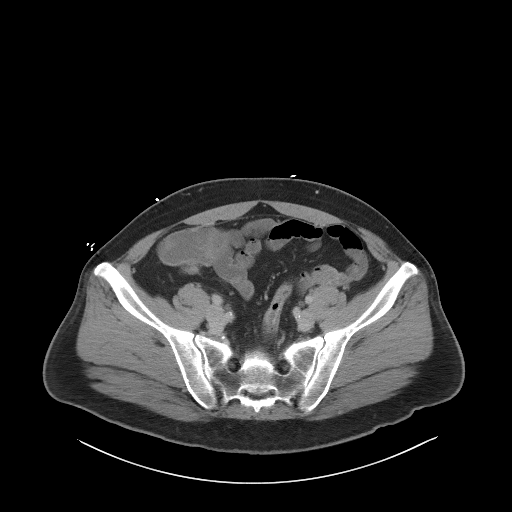
[im 43/96  soft-tissue]
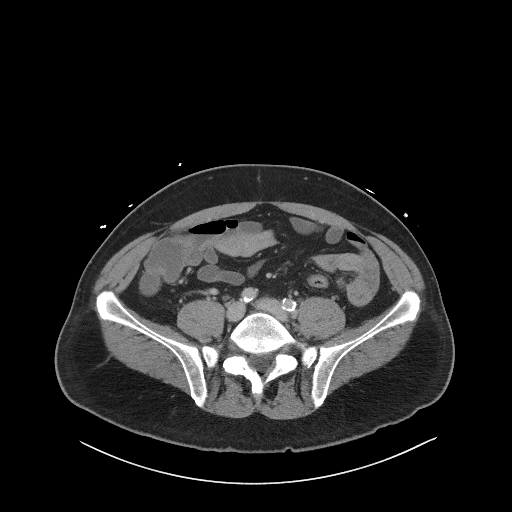
[im 48/96  soft-tissue]
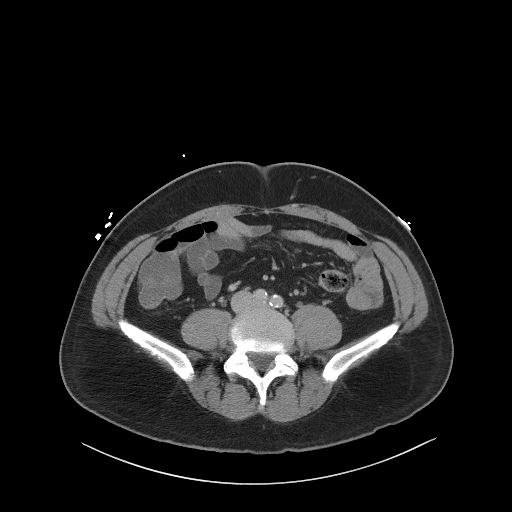
[im 53/96  soft-tissue]
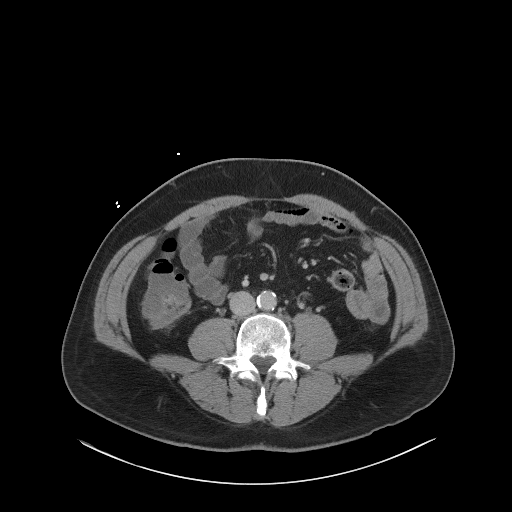
[im 62/96  soft-tissue]
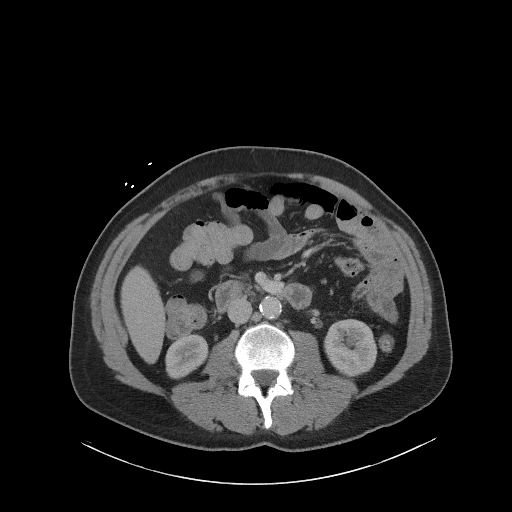
[im 62/96  bone]
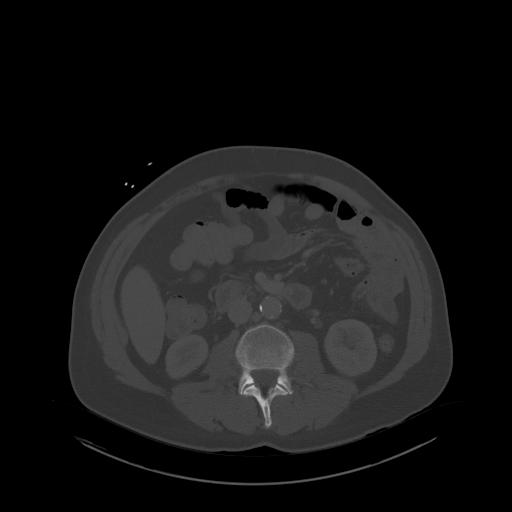
[im 67/96  soft-tissue]
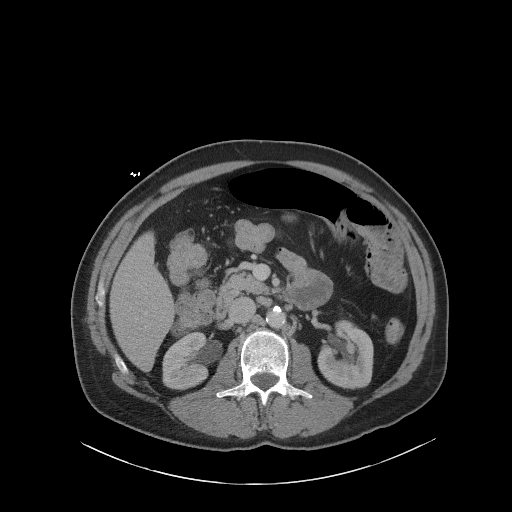
[im 77/96  soft-tissue]
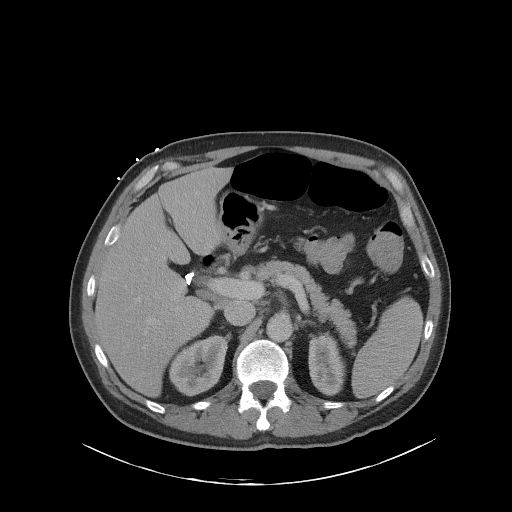
[im 81/96  soft-tissue]
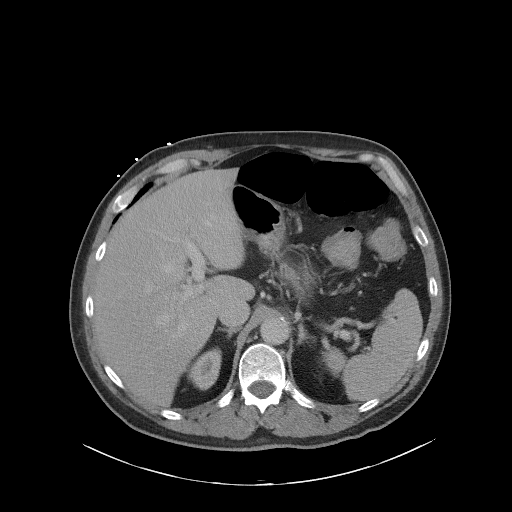
[im 91/96  soft-tissue]
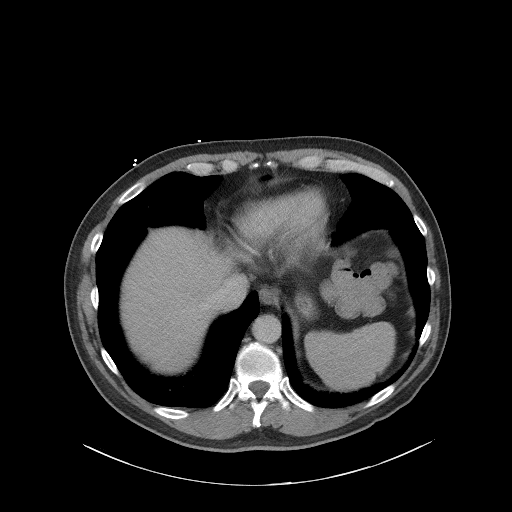

[Series 4: coronal st · coronal · 0.77mm/px · 3 of 104 slices shown]
[im 35/104  soft-tissue]
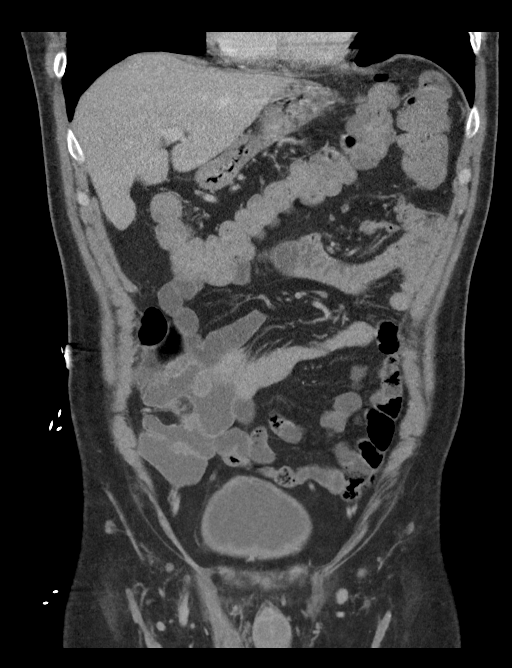
[im 46/104  soft-tissue]
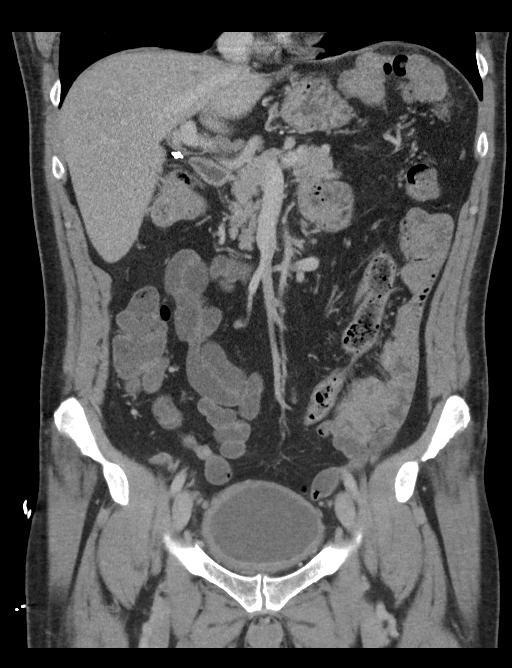
[im 58/104  soft-tissue]
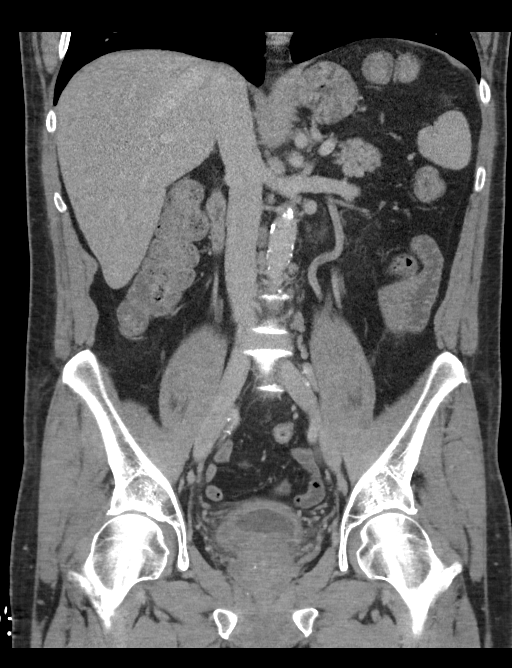

[16 of 46 positions shown; findings below may reference images not displayed]

FINDINGS: Lower chest: No acute abnormality.

Hepatobiliary: No focal liver abnormality is seen. Status post
cholecystectomy. No biliary dilatation.

Pancreas: Unremarkable. No pancreatic ductal dilatation or
surrounding inflammatory changes.

Spleen: Normal in size without focal abnormality.

Adrenals/Urinary Tract: Adrenal glands are unremarkable. Kidneys are
normal, without renal calculi, focal lesion, or hydronephrosis.
Diffuse bladder wall thickening.

Stomach/Bowel: Stomach is within normal limits. Appendix appears
normal. No evidence of bowel wall thickening, distention, or
inflammatory changes.

Vascular/Lymphatic: Aortic atherosclerosis. No enlarged abdominal or
pelvic lymph nodes.

Reproductive: Inflammation within the pelvis surrounding seminal
vesicles and prostate extending along course of the enhancing right
spermatic cord into the right inguinal canal.

Other: No abdominal wall hernia or abnormality. No abdominopelvic
ascites.

Musculoskeletal: No acute or significant osseous findings.
IMPRESSION: 1. Inflammation within the pelvis surrounding seminal vesicles and
prostate extending along course of the enhancing right spermatic
cord into the right inguinal canal. Epididymitis on same-day scrotal
ultrasound. Findings likely represent infection of the genital
system.
2. Diffuse bladder wall thickening may be reactive, reflect
cystitis, or chronic outflow obstruction.
